# Patient Record
Sex: Female | Born: 1935 | Race: White | Hispanic: No | Smoking: Never smoker
Health system: Southern US, Community
[De-identification: ages and names within clinical notes are randomized; demographics above are authoritative.]

## PROBLEM LIST (undated history)

## (undated) DIAGNOSIS — K219 Gastro-esophageal reflux disease without esophagitis: Secondary | ICD-10-CM

## (undated) DIAGNOSIS — M1990 Unspecified osteoarthritis, unspecified site: Secondary | ICD-10-CM

## (undated) DIAGNOSIS — I10 Essential (primary) hypertension: Secondary | ICD-10-CM

## (undated) HISTORY — PX: HERNIA REPAIR: SHX51

## (undated) HISTORY — DX: Gastro-esophageal reflux disease without esophagitis: K21.9

## (undated) HISTORY — DX: Unspecified osteoarthritis, unspecified site: M19.90

---

## 1997-09-29 ENCOUNTER — Inpatient Hospital Stay (HOSPITAL_COMMUNITY): Admission: EM | Admit: 1997-09-29 | Discharge: 1997-10-02 | Payer: Self-pay | Admitting: Emergency Medicine

## 1997-10-20 ENCOUNTER — Encounter: Admission: RE | Admit: 1997-10-20 | Discharge: 1997-10-20 | Payer: Self-pay | Admitting: Family Medicine

## 1997-10-25 ENCOUNTER — Ambulatory Visit (HOSPITAL_COMMUNITY): Admission: RE | Admit: 1997-10-25 | Discharge: 1997-10-25 | Payer: Self-pay

## 1997-11-03 ENCOUNTER — Encounter: Admission: RE | Admit: 1997-11-03 | Discharge: 1997-11-03 | Payer: Self-pay | Admitting: Family Medicine

## 1997-12-06 ENCOUNTER — Encounter: Admission: RE | Admit: 1997-12-06 | Discharge: 1997-12-06 | Payer: Self-pay | Admitting: Family Medicine

## 1997-12-07 ENCOUNTER — Encounter: Admission: RE | Admit: 1997-12-07 | Discharge: 1997-12-07 | Payer: Self-pay | Admitting: Sports Medicine

## 1997-12-17 ENCOUNTER — Other Ambulatory Visit: Admission: RE | Admit: 1997-12-17 | Discharge: 1997-12-17 | Payer: Self-pay | Admitting: Urology

## 2000-03-31 ENCOUNTER — Encounter: Admission: RE | Admit: 2000-03-31 | Discharge: 2000-03-31 | Payer: Self-pay

## 2001-03-26 ENCOUNTER — Emergency Department (HOSPITAL_COMMUNITY): Admission: EM | Admit: 2001-03-26 | Discharge: 2001-03-26 | Payer: Self-pay | Admitting: Emergency Medicine

## 2001-03-26 ENCOUNTER — Encounter: Payer: Self-pay | Admitting: Emergency Medicine

## 2001-03-27 ENCOUNTER — Emergency Department (HOSPITAL_COMMUNITY): Admission: EM | Admit: 2001-03-27 | Discharge: 2001-03-27 | Payer: Self-pay | Admitting: Emergency Medicine

## 2001-05-19 ENCOUNTER — Encounter: Payer: Self-pay | Admitting: Surgery

## 2001-05-19 ENCOUNTER — Encounter: Admission: RE | Admit: 2001-05-19 | Discharge: 2001-05-19 | Payer: Self-pay | Admitting: Surgery

## 2001-05-21 ENCOUNTER — Ambulatory Visit (HOSPITAL_BASED_OUTPATIENT_CLINIC_OR_DEPARTMENT_OTHER): Admission: RE | Admit: 2001-05-21 | Discharge: 2001-05-22 | Payer: Self-pay | Admitting: Surgery

## 2006-03-05 ENCOUNTER — Ambulatory Visit: Payer: Self-pay

## 2015-03-12 ENCOUNTER — Emergency Department
Admission: EM | Admit: 2015-03-12 | Discharge: 2015-03-12 | Disposition: A | Discharge status: Home / Self Care | Payer: Medicare Other | Attending: Emergency Medicine | Admitting: Emergency Medicine

## 2015-03-12 ENCOUNTER — Encounter: Payer: Self-pay | Admitting: *Deleted

## 2015-03-12 DIAGNOSIS — I159 Secondary hypertension, unspecified: Secondary | ICD-10-CM | POA: Insufficient documentation

## 2015-03-12 DIAGNOSIS — Z880 Allergy status to penicillin: Secondary | ICD-10-CM | POA: Insufficient documentation

## 2015-03-12 DIAGNOSIS — I10 Essential (primary) hypertension: Secondary | ICD-10-CM | POA: Diagnosis present

## 2015-03-12 LAB — COMPREHENSIVE METABOLIC PANEL
ALBUMIN: 3.7 g/dL (ref 3.5–5.0)
ALK PHOS: 75 U/L (ref 38–126)
ALT: 12 U/L — ABNORMAL LOW (ref 14–54)
ANION GAP: 6 (ref 5–15)
AST: 22 U/L (ref 15–41)
BILIRUBIN TOTAL: 0.6 mg/dL (ref 0.3–1.2)
BUN: 12 mg/dL (ref 6–20)
CALCIUM: 8.7 mg/dL — AB (ref 8.9–10.3)
CO2: 28 mmol/L (ref 22–32)
Chloride: 107 mmol/L (ref 101–111)
Creatinine, Ser: 0.71 mg/dL (ref 0.44–1.00)
GFR calc Af Amer: >60 mL/min (ref >60)
GFR calc non Af Amer: >60 mL/min (ref >60)
GLUCOSE: 120 mg/dL — AB (ref 65–99)
Potassium: 3.5 mmol/L (ref 3.5–5.1)
Sodium: 141 mmol/L (ref 135–145)
TOTAL PROTEIN: 6.6 g/dL (ref 6.5–8.1)

## 2015-03-12 LAB — URINALYSIS COMPLETE WITH MICROSCOPIC (ARMC ONLY)
BILIRUBIN URINE: NEGATIVE
Bacteria, UA: NONE SEEN
Glucose, UA: NEGATIVE mg/dL
KETONES UR: NEGATIVE mg/dL
LEUKOCYTES UA: NEGATIVE
Nitrite: NEGATIVE
Protein, ur: NEGATIVE mg/dL
SQUAMOUS EPITHELIAL / LPF: NONE SEEN
Specific Gravity, Urine: 1.003 — ABNORMAL LOW (ref 1.005–1.030)
pH: 6 (ref 5.0–8.0)

## 2015-03-12 LAB — CBC WITH DIFFERENTIAL/PLATELET
BASOS PCT: 1 %
Basophils Absolute: 0 10*3/uL (ref 0–0.1)
Eosinophils Absolute: 0 10*3/uL (ref 0–0.7)
Eosinophils Relative: 0 %
HEMATOCRIT: 37.2 % (ref 35.0–47.0)
HEMOGLOBIN: 12.3 g/dL (ref 12.0–16.0)
LYMPHS ABS: 2.4 10*3/uL (ref 1.0–3.6)
LYMPHS PCT: 30 %
MCH: 27.7 pg (ref 26.0–34.0)
MCHC: 33.1 g/dL (ref 32.0–36.0)
MCV: 83.5 fL (ref 80.0–100.0)
MONOS PCT: 10 %
Monocytes Absolute: 0.8 10*3/uL (ref 0.2–0.9)
NEUTROS ABS: 4.7 10*3/uL (ref 1.4–6.5)
NEUTROS PCT: 59 %
Platelets: 244 10*3/uL (ref 150–440)
RBC: 4.45 MIL/uL (ref 3.80–5.20)
RDW: 14.8 % — ABNORMAL HIGH (ref 11.5–14.5)
WBC: 8 10*3/uL (ref 3.6–11.0)

## 2015-03-12 LAB — TROPONIN I: Troponin I: <0.03 ng/mL (ref <0.031)

## 2015-03-12 NOTE — ED Provider Notes (Signed)
[facility] Emergency Department Provider Note  ____________________________________________  Time seen: 1245  I have reviewed the triage vital signs and the nursing notes.   HISTORY  Chief Complaint Hypertension   History limited by: Not Limited   HPI Alexis Whitney is a 81 y.o. female who presents to the emergency department today because she was not feeling well. She states that when she woke up this morning she was feeling her normal self. Then when she was doing a load of laundry she started trembling. She states it was her whole body. She was not in any pain during this, she denied any shortness of breath or chest pain. She states that she now feels better. She has not had any recent fevers, no recent sick contacts, no nausea or vomiting, no change in urination or bowel movement.      No past medical history on file.  There are no active problems to display for this patient.   No past surgical history on file.  No current outpatient prescriptions on file.  Allergies Aspirin; Sulfa antibiotics; and Penicillins  No family history on file.  Social History Social History  Substance Use Topics  . Smoking status: Not on file  . Smokeless tobacco: Not on file  . Alcohol Use: Not on file    Review of Systems  Constitutional: Negative for fever. Cardiovascular: Negative for chest pain. Respiratory: Negative for shortness of breath. Gastrointestinal: Negative for abdominal pain, vomiting and diarrhea. Genitourinary: Negative for dysuria. Musculoskeletal: Negative for back pain. Skin: Negative for rash. Neurological: Negative for headaches, focal weakness or numbness.   10-point ROS otherwise negative.  ____________________________________________   PHYSICAL EXAM:  VITAL SIGNS: ED Triage Vitals  Enc Vitals Group     BP 03/12/15 1241 179/75 mmHg     Pulse Rate 03/12/15 1241 82     Resp 03/12/15 1241 18     Temp 03/12/15 1241 98 F  (36.7 C)     Temp src --      SpO2 03/12/15 1241 97 %   Constitutional: Alert and oriented. Well appearing and in no distress. Eyes: Conjunctivae are normal. PERRL. Normal extraocular movements. ENT   Head: Normocephalic and atraumatic.   Nose: No congestion/rhinnorhea.   Mouth/Throat: Mucous membranes are moist.   Neck: No stridor. Hematological/Lymphatic/Immunilogical: No cervical lymphadenopathy. Cardiovascular: Normal rate, regular rhythm.  No murmurs, rubs, or gallops. Respiratory: Normal respiratory effort without tachypnea nor retractions. Breath sounds are clear and equal bilaterally. No wheezes/rales/rhonchi. Gastrointestinal: Soft and nontender. No distention.  Genitourinary: Deferred Musculoskeletal: Normal range of motion in all extremities. No joint effusions.  No lower extremity tenderness nor edema. Neurologic:  Normal speech and language. No gross focal neurologic deficits are appreciated. Speech is normal.  Skin:  Skin is warm, dry and intact. No rash noted. Psychiatric: Mood and affect are normal. Speech and behavior are normal. Patient exhibits appropriate insight and judgment.  ____________________________________________    LABS (pertinent positives/negatives)  Labs Reviewed  CBC WITH DIFFERENTIAL/PLATELET - Abnormal; Notable for the following:    RDW 14.8 (*)    All other components within normal limits  COMPREHENSIVE METABOLIC PANEL - Abnormal; Notable for the following:    Glucose, Bld 120 (*)    Calcium 8.7 (*)    ALT 12 (*)    All other components within normal limits  URINALYSIS COMPLETEWITH MICROSCOPIC ([facility] ONLY) - Abnormal; Notable for the following:    Color, Urine STRAW (*)    APPearance CLEAR (*)  Specific Gravity, Urine 1.003 (*)    Hgb urine dipstick 2+ (*)    All other components within normal limits  TROPONIN I     ____________________________________________   EKG  I, Phineas Semen, attending physician,  personally viewed and interpreted this EKG  EKG Time: 1251 Rate: 83 Rhythm: NSR Axis: normal Intervals: qtc 451 QRS: narrow ST changes: no st elevation Impression: no concerning findings   ____________________________________________    RADIOLOGY  None  ____________________________________________   PROCEDURES  Procedure(s) performed: None  Critical Care performed: No  ____________________________________________   INITIAL IMPRESSION / ASSESSMENT AND PLAN / ED COURSE  Pertinent labs & imaging results that were available during my care of the patient were reviewed by me and considered in my medical decision making (see chart for details).  Patient presents to the emergency department with concerns for feeling unwell. On physical exam patient appears well, no concerning findings. Blood work and urine without concerning findings per patient does state she feels better. I discussed return precautions with the patient. Encourage primary care follow-up.  ____________________________________________   FINAL CLINICAL IMPRESSION(S) / ED DIAGNOSES  Final diagnoses:  Secondary hypertension, unspecified     Phineas Semen, MD 03/12/15 1528

## 2015-03-12 NOTE — ED Notes (Signed)
Patient denies pain and is resting comfortably.  

## 2015-03-12 NOTE — Discharge Instructions (Signed)
Please seek medical attention for any high fevers, chest pain, shortness of breath, change in behavior, persistent vomiting, bloody stool or any other new or concerning symptoms. ° °Hypertension °Hypertension, commonly called high blood pressure, is when the force of blood pumping through your arteries is too strong. Your arteries are the blood vessels that carry blood from your heart throughout your body. A blood pressure reading consists of a higher number over a lower number, such as 110/72. The higher number (systolic) is the pressure inside your arteries when your heart pumps. The lower number (diastolic) is the pressure inside your arteries when your heart relaxes. Ideally you want your blood pressure below 120/80. °Hypertension forces your heart to work harder to pump blood. Your arteries may become narrow or stiff. Having hypertension puts you at risk for heart disease, stroke, and other problems.  °RISK FACTORS °Some risk factors for high blood pressure are controllable. Others are not.  °Risk factors you cannot control include:  °· Race. You may be at higher risk if you are African American. °· Age. Risk increases with age. °· Gender. Men are at higher risk than women before age 45 years. After age 65, women are at higher risk than men. °Risk factors you can control include: °· Not getting enough exercise or physical activity. °· Being overweight. °· Getting too much fat, sugar, calories, or salt in your diet. °· Drinking too much alcohol. °SIGNS AND SYMPTOMS °Hypertension does not usually cause signs or symptoms. Extremely high blood pressure (hypertensive crisis) may cause headache, anxiety, shortness of breath, and nosebleed. °DIAGNOSIS  °To check if you have hypertension, your health care provider will measure your blood pressure while you are seated, with your arm held at the level of your heart. It should be measured at least twice using the same arm. Certain conditions can cause a difference in  blood pressure between your right and left arms. A blood pressure reading that is higher than normal on one occasion does not mean that you need treatment. If one blood pressure reading is high, ask your health care provider about having it checked again. °TREATMENT  °Treating high blood pressure includes making lifestyle changes and possibly taking medicine. Living a healthy lifestyle can help lower high blood pressure. You may need to change some of your habits. °Lifestyle changes may include: °· Following the DASH diet. This diet is high in fruits, vegetables, and whole grains. It is low in salt, red meat, and added sugars. °· Getting at least 2½ hours of brisk physical activity every week. °· Losing weight if necessary. °· Not smoking. °· Limiting alcoholic beverages. °· Learning ways to reduce stress. ° If lifestyle changes are not enough to get your blood pressure under control, your health care provider may prescribe medicine. You may need to take more than one. Work closely with your health care provider to understand the risks and benefits. °HOME CARE INSTRUCTIONS °· Have your blood pressure rechecked as directed by your health care provider.   °· Take medicines only as directed by your health care provider. Follow the directions carefully. Blood pressure medicines must be taken as prescribed. The medicine does not work as well when you skip doses. Skipping doses also puts you at risk for problems.   °· Do not smoke.   °· Monitor your blood pressure at home as directed by your health care provider.  °SEEK MEDICAL CARE IF:  °· You think you are having a reaction to medicines taken. °· You have recurrent headaches or feel   dizzy. °· You have swelling in your ankles. °· You have trouble with your vision. °SEEK IMMEDIATE MEDICAL CARE IF: °· You develop a severe headache or confusion. °· You have unusual weakness, numbness, or feel faint. °· You have severe chest or abdominal pain. °· You vomit repeatedly. °· You  have trouble breathing. °MAKE SURE YOU:  °· Understand these instructions. °· Will watch your condition. °· Will get help right away if you are not doing well or get worse. °Document Released: 06/16/2005 Document Revised: 10/31/2013 Document Reviewed: 04/08/2013 °[employer]® Patient Information ©2015 [employer]. This information is not intended to replace advice given to you by your health care provider. Make sure you discuss any questions you have with your health care provider. ° °

## 2015-03-12 NOTE — ED Notes (Signed)
Pt called EMS due to "not feeling well" Pt was found to be hypertensive. Pt denies any CP. Pt in no acute distress

## 2015-12-16 ENCOUNTER — Emergency Department
Admission: EM | Admit: 2015-12-16 | Discharge: 2015-12-16 | Disposition: A | Discharge status: Home / Self Care | Payer: Medicare Other | Attending: Emergency Medicine | Admitting: Emergency Medicine

## 2015-12-16 DIAGNOSIS — R251 Tremor, unspecified: Secondary | ICD-10-CM | POA: Diagnosis not present

## 2015-12-16 DIAGNOSIS — I159 Secondary hypertension, unspecified: Secondary | ICD-10-CM | POA: Diagnosis not present

## 2015-12-16 DIAGNOSIS — Z79899 Other long term (current) drug therapy: Secondary | ICD-10-CM | POA: Insufficient documentation

## 2015-12-16 DIAGNOSIS — R531 Weakness: Secondary | ICD-10-CM | POA: Diagnosis present

## 2015-12-16 HISTORY — DX: Essential (primary) hypertension: I10

## 2015-12-16 LAB — BASIC METABOLIC PANEL
Anion gap: 8 (ref 5–15)
BUN: 18 mg/dL (ref 6–20)
CHLORIDE: 106 mmol/L (ref 101–111)
CO2: 26 mmol/L (ref 22–32)
Calcium: 8.8 mg/dL — ABNORMAL LOW (ref 8.9–10.3)
Creatinine, Ser: 0.82 mg/dL (ref 0.44–1.00)
GFR calc Af Amer: >60 mL/min (ref >60)
GFR calc non Af Amer: >60 mL/min (ref >60)
GLUCOSE: 123 mg/dL — AB (ref 65–99)
POTASSIUM: 3.5 mmol/L (ref 3.5–5.1)
SODIUM: 140 mmol/L (ref 135–145)

## 2015-12-16 LAB — URINALYSIS COMPLETE WITH MICROSCOPIC (ARMC ONLY)
Bilirubin Urine: NEGATIVE
GLUCOSE, UA: NEGATIVE mg/dL
KETONES UR: NEGATIVE mg/dL
NITRITE: NEGATIVE
PROTEIN: NEGATIVE mg/dL
SPECIFIC GRAVITY, URINE: 1.008 (ref 1.005–1.030)
Squamous Epithelial / LPF: NONE SEEN
pH: 6 (ref 5.0–8.0)

## 2015-12-16 LAB — CBC
HEMATOCRIT: 38.4 % (ref 35.0–47.0)
HEMOGLOBIN: 12.7 g/dL (ref 12.0–16.0)
MCH: 27.1 pg (ref 26.0–34.0)
MCHC: 33.1 g/dL (ref 32.0–36.0)
MCV: 82 fL (ref 80.0–100.0)
Platelets: 272 10*3/uL (ref 150–440)
RBC: 4.69 MIL/uL (ref 3.80–5.20)
RDW: 14.6 % — ABNORMAL HIGH (ref 11.5–14.5)
WBC: 14.5 10*3/uL — AB (ref 3.6–11.0)

## 2015-12-16 LAB — TROPONIN I

## 2015-12-16 NOTE — ED Notes (Signed)
MD Webster at bedside 

## 2015-12-16 NOTE — ED Notes (Signed)
Reviewed d/c instructions, follow-up care with pt. Pt verbalized understanding 

## 2015-12-16 NOTE — Discharge Instructions (Signed)
Hypertension Hypertension, commonly called high blood pressure, is when the force of blood pumping through your arteries is too strong. Your arteries are the blood vessels that carry blood from your heart throughout your body. A blood pressure reading consists of a higher number over a lower number, such as 110/72. The higher number (systolic) is the pressure inside your arteries when your heart pumps. The lower number (diastolic) is the pressure inside your arteries when your heart relaxes. Ideally you want your blood pressure below 120/80. Hypertension forces your heart to work harder to pump blood. Your arteries may become narrow or stiff. Having untreated or uncontrolled hypertension can cause heart attack, stroke, kidney disease, and other problems. RISK FACTORS Some risk factors for high blood pressure are controllable. Others are not.  Risk factors you cannot control include:   Race. You may be at higher risk if you are African American.  Age. Risk increases with age.  Gender. Men are at higher risk than women before age 45 years. After age 65, women are at higher risk than men. Risk factors you can control include:  Not getting enough exercise or physical activity.  Being overweight.  Getting too much fat, sugar, calories, or salt in your diet.  Drinking too much alcohol. SIGNS AND SYMPTOMS Hypertension does not usually cause signs or symptoms. Extremely high blood pressure (hypertensive crisis) may cause headache, anxiety, shortness of breath, and nosebleed. DIAGNOSIS To check if you have hypertension, your health care provider will measure your blood pressure while you are seated, with your arm held at the level of your heart. It should be measured at least twice using the same arm. Certain conditions can cause a difference in blood pressure between your right and left arms. A blood pressure reading that is higher than normal on one occasion does not mean that you need treatment. If  it is not clear whether you have high blood pressure, you may be asked to return on a different day to have your blood pressure checked again. Or, you may be asked to monitor your blood pressure at home for 1 or more weeks. TREATMENT Treating high blood pressure includes making lifestyle changes and possibly taking medicine. Living a healthy lifestyle can help lower high blood pressure. You may need to change some of your habits. Lifestyle changes may include:  Following the DASH diet. This diet is high in fruits, vegetables, and whole grains. It is low in salt, red meat, and added sugars.  Keep your sodium intake below 2,300 mg per day.  Getting at least 30-45 minutes of aerobic exercise at least 4 times per week.  Losing weight if necessary.  Not smoking.  Limiting alcoholic beverages.  Learning ways to reduce stress. Your health care provider may prescribe medicine if lifestyle changes are not enough to get your blood pressure under control, and if one of the following is true:  You are 18-59 years of age and your systolic blood pressure is above 140.  You are 60 years of age or older, and your systolic blood pressure is above 150.  Your diastolic blood pressure is above 90.  You have diabetes, and your systolic blood pressure is over 140 or your diastolic blood pressure is over 90.  You have kidney disease and your blood pressure is above 140/90.  You have heart disease and your blood pressure is above 140/90. Your personal target blood pressure may vary depending on your medical conditions, your age, and other factors. HOME CARE INSTRUCTIONS    Have your blood pressure rechecked as directed by your health care provider.   Take medicines only as directed by your health care provider. Follow the directions carefully. Blood pressure medicines must be taken as prescribed. The medicine does not work as well when you skip doses. Skipping doses also puts you at risk for  problems.  Do not smoke.   Monitor your blood pressure at home as directed by your health care provider. SEEK MEDICAL CARE IF:   You think you are having a reaction to medicines taken.  You have recurrent headaches or feel dizzy.  You have swelling in your ankles.  You have trouble with your vision. SEEK IMMEDIATE MEDICAL CARE IF:  You develop a severe headache or confusion.  You have unusual weakness, numbness, or feel faint.  You have severe chest or abdominal pain.  You vomit repeatedly.  You have trouble breathing. MAKE SURE YOU:   Understand these instructions.  Will watch your condition.  Will get help right away if you are not doing well or get worse.   This information is not intended to replace advice given to you by your health care provider. Make sure you discuss any questions you have with your health care provider.   Document Released: 06/16/2005 Document Revised: 10/31/2014 Document Reviewed: 04/08/2013 Elsevier Interactive Patient Education 2016 [employer]  Weakness Weakness is a lack of strength. It may be felt all over the body (generalized) or in one specific part of the body (focal). Some causes of weakness can be serious. You may need further medical evaluation, especially if you are elderly or you have a history of immunosuppression (such as chemotherapy or HIV), kidney disease, heart disease, or diabetes. CAUSES  Weakness can be caused by many different things, including:  Infection.  Physical exhaustion.  Internal bleeding or other blood loss that results in a lack of red blood cells (anemia).  Dehydration. This cause is more common in elderly people.  Side effects or electrolyte abnormalities from medicines, such as pain medicines or sedatives.  Emotional distress, anxiety, or depression.  Circulation problems, especially severe peripheral arterial disease.  Heart disease, such as rapid atrial fibrillation, bradycardia, or heart  failure.  Nervous system disorders, such as Guillain-Barr syndrome, multiple sclerosis, or stroke. DIAGNOSIS  To find the cause of your weakness, your caregiver will take your history and perform a physical exam. Lab tests or X-rays may also be ordered, if needed. TREATMENT  Treatment of weakness depends on the cause of your symptoms and can vary greatly. HOME CARE INSTRUCTIONS   Rest as needed.  Eat a well-balanced diet.  Try to get some exercise every day.  Only take over-the-counter or prescription medicines as directed by your caregiver. SEEK MEDICAL CARE IF:   Your weakness seems to be getting worse or spreads to other parts of your body.  You develop new aches or pains. SEEK IMMEDIATE MEDICAL CARE IF:   You cannot perform your normal daily activities, such as getting dressed and feeding yourself.  You cannot walk up and down stairs, or you feel exhausted when you do so.  You have shortness of breath or chest pain.  You have difficulty moving parts of your body.  You have weakness in only one area of the body or on only one side of the body.  You have a fever.  You have trouble speaking or swallowing.  You cannot control your bladder or bowel movements.  You have black or bloody vomit or stools. MAKE  SURE YOU:  Understand these instructions.  Will watch your condition.  Will get help right away if you are not doing well or get worse.   This information is not intended to replace advice given to you by your health care provider. Make sure you discuss any questions you have with your health care provider.   Document Released: 06/16/2005 Document Revised: 12/16/2011 Document Reviewed: 08/15/2011 Elsevier Interactive Patient Education 2016 [employer]  Tremor A tremor is trembling or shaking that you cannot control. Most tremors affect the hands or arms. Tremors can also affect the head, vocal cords, face, and other parts of the body. There are many types of  tremors. Common types include:   Essential tremor. These usually occur in people over the age of 80. It may run in families and can happen in otherwise healthy people.   Resting tremor. These occur when the muscles are at rest, such as when your hands are resting in your lap. People with Parkinson disease often have resting tremors.   Postural tremor. These occur when you try to hold a pose, such as keeping your hands outstretched.   Kinetic tremor. These occur during purposeful movement, such as trying to touch a finger to your nose.   Task-specific tremor. These may occur when you perform tasks such as handwriting, speaking, or standing.   Psychogenic tremor. These dramatically lessen or disappear when you are distracted. They can happen in people of all ages.  Some types of tremors have no known cause. Tremors can also be a symptom of nervous system problems (neurological disorders) that may occur with aging. Some tremors go away with treatment while others do not.  HOME CARE INSTRUCTIONS Watch your tremor for any changes. The following actions may help to lessen any discomfort you are feeling:  Take medicines only as directed by your health care provider.   Limit alcohol intake to no more than 1 drink per day for nonpregnant women and 2 drinks per day for men. One drink equals 12 oz of beer, 5 oz of wine, or 1 oz of hard liquor.  Do not use any tobacco products, including cigarettes, chewing tobacco, or electronic cigarettes. If you need help quitting, ask your health care provider.   Avoid extreme heat or cold.   Limit the amount of caffeine you consumeas directed by your health care provider.   Try to get 8 hours of sleep each night.  Find ways to manage your stress, such as meditation or yoga.  Keep all follow-up visits as directed by your health care provider. This is important. SEEK MEDICAL CARE IF:  You start having a tremor after starting a new  medicine.  You have tremor with other symptoms such as:  Numbness.  Tingling.  Pain.  Weakness.  Your tremor gets worse.  Your tremor interferes with your day-to-day life.   This information is not intended to replace advice given to you by your health care provider. Make sure you discuss any questions you have with your health care provider.   Document Released: 06/06/2002 Document Revised: 07/07/2014 Document Reviewed: 12/12/2013 Elsevier Interactive Patient Education [employer]

## 2015-12-16 NOTE — ED Provider Notes (Signed)
[facility] Emergency Department Provider Note   ____________________________________________  Time seen: Approximately 351 AM  I have reviewed the triage vital signs and the nursing notes.   HISTORY  Chief Complaint Hypertension    HPI Alexis Whitney is a 82 y.o. female who comes into the hospital today with elevated blood pressure, shaking and weak feeling. The patient reports that she was in bed when she had just dozed off and something woke her up. She reports that she didn't feel right. Her legs were trembling and her daughter had to help her out of bed. The patient denies any headache denies chest pain denies any shortness of breath. She reports that she felt well today and cleaned out much. The patient reports that she took her blood pressure medicine today without any difficulty. The patient denies any nausea or vomiting denies any headache denies blurred vision. She reports that she feels less weak than she did previously right now and that she is not shaking any more. The patient was concerned about what might be going on so she decided to come into the hospital for evaluation.   Past Medical History  Diagnosis Date  . Hypertension     There are no active problems to display for this patient.   Past Surgical History  Procedure Laterality Date  . Hernia repair      Current Outpatient Rx  Name  Route  Sig  Dispense  Refill  . lisinopril (PRINIVIL,ZESTRIL) 5 MG tablet   Oral   Take 5 mg by mouth daily.         . pantoprazole (PROTONIX) 20 MG tablet   Oral   Take 20 mg by mouth daily.           Allergies Aspirin; Sulfa antibiotics; and Penicillins  No family history on file.  Social History Social History  Substance Use Topics  . Smoking status: Never Smoker   . Smokeless tobacco: Never Used  . Alcohol Use: No    Review of Systems Constitutional: Shakiness Eyes: No visual changes. ENT: No sore throat. Cardiovascular:  Denies chest pain. Respiratory: Denies shortness of breath. Gastrointestinal: No abdominal pain.  No nausea, no vomiting.  No diarrhea.  No constipation. Genitourinary: Negative for dysuria. Musculoskeletal: Negative for back pain. Skin: Negative for rash. Neurological: Generalized weakness.  10-point ROS otherwise negative.  ____________________________________________   PHYSICAL EXAM:  VITAL SIGNS: ED Triage Vitals  Enc Vitals Group     BP 12/16/15 0231 196/70 mmHg     Pulse Rate 12/16/15 0231 68     Resp 12/16/15 0231 16     Temp 12/16/15 0225 98.7 F (37.1 C)     Temp Source 12/16/15 0225 Oral     SpO2 12/16/15 0222 98 %     Weight 12/16/15 0231 116 lb (52.617 kg)     Height 12/16/15 0231 5\' 4"  (1.626 m)     Head Cir --      Peak Flow --      Pain Score --      Pain Loc --      Pain Edu? --      Excl. in GC? --     Constitutional: Alert and oriented. Well appearing and in no acute distress. Eyes: Conjunctivae are normal. PERRL. EOMI. Head: Atraumatic. Nose: No congestion/rhinnorhea. Mouth/Throat: Mucous membranes are moist.  Oropharynx non-erythematous. Cardiovascular: Normal rate, regular rhythm. Grossly normal heart sounds.  Good peripheral circulation. Respiratory: Normal respiratory effort.  No retractions.  Lungs CTAB. Gastrointestinal: Soft and nontender. No distention. Positive bowel sounds Musculoskeletal: No lower extremity tenderness nor edema.   Neurologic:  Normal speech and language. Cranial nerves II through XII are grossly intact with no focal motor or neuro deficits Skin:  Skin is warm, dry and intact.  Psychiatric: Mood and affect are normal.   ____________________________________________   LABS (all labs ordered are listed, but only abnormal results are displayed)  Labs Reviewed  BASIC METABOLIC PANEL - Abnormal; Notable for the following:    Glucose, Bld 123 (*)    Calcium 8.8 (*)    All other components within normal limits  CBC -  Abnormal; Notable for the following:    WBC 14.5 (*)    RDW 14.6 (*)    All other components within normal limits  URINALYSIS COMPLETEWITH MICROSCOPIC ([facility] ONLY) - Abnormal; Notable for the following:    Color, Urine STRAW (*)    APPearance CLEAR (*)    Hgb urine dipstick 2+ (*)    Leukocytes, UA TRACE (*)    Bacteria, UA RARE (*)    All other components within normal limits  TROPONIN I  TROPONIN I   ____________________________________________  EKG  ED ECG REPORT I, Rebecka Apley, the attending physician, personally viewed and interpreted this ECG.   Date: 12/16/2015  EKG Time: 232  Rate: 75  Rhythm: normal sinus rhythm  Axis: normal  Intervals:none  ST&T Change: none  ____________________________________________  RADIOLOGY  None ____________________________________________   PROCEDURES  Procedure(s) performed: None  Critical Care performed: No  ____________________________________________   INITIAL IMPRESSION / ASSESSMENT AND PLAN / ED COURSE  Pertinent labs & imaging results that were available during my care of the patient were reviewed by me and considered in my medical decision making (see chart for details).  This is an 82 year old female who comes into the hospital today with some elevated blood pressure as well as feeling shaky and having generalized weakness. Symptoms have resolved at this time and the patient's blood pressure is improving without any significant intervention. I did check the patient's urine with a concern for infection making her feel unwell but her urinalysis is unremarkable. The patient does have a white blood cell count of 14.5 But she has no other complaints at all to explain her symptoms. I feel the patient should follow back up with her primary care physician for further evaluation. The patient has no further complaints or concerns needed as her family. ____________________________________________   FINAL CLINICAL  IMPRESSION(S) / ED DIAGNOSES  Final diagnoses:  Secondary hypertension, unspecified  Weakness  Tremor      NEW MEDICATIONS STARTED DURING THIS VISIT:  Discharge Medication List as of 12/16/2015  6:33 AM       Note:  This document was prepared using Dragon voice recognition software and may include unintentional dictation errors.    Rebecka Apley, MD 12/16/15 [phone]

## 2015-12-16 NOTE — ED Notes (Signed)
Per EMS: Pt c/o of hypertension. Pt reports she woke up at 1300 and had all over body trembling. Pt had similar episode in past, and was dx with hypertension and placed on antihypertensive medication. Pt denies pain, LOC.

## 2016-02-03 NOTE — Progress Notes (Signed)
02/04/2016 3:50 PM   Alexis Whitney 03-03-1925 [id number]  Referring provider: Raynelle Bring [address] [place], [place] [address]  Chief Complaint  Patient presents with  . New Patient (Initial Visit)    hematuria    HPI: Patient is a 82 -year-old Caucasian female who presents today as a referral from their PCP, [facility] , for microscopic hematuria.  Patient was found to have microscopic hematuria on 12/16/2015 with 6-30 RBC's/hpf.  Patient doesn't have a prior history of microscopic hematuria.    She does not have a prior history of recurrent urinary tract infections, nephrolithiasis, trauma to the genitourinary tract or malignancies of the genitourinary tract.   She does  have a family medical history of malignancies of the genitourinary tract (mother with kidney cancer) and hematuria (sisters).    She does not have a family medical history of nephrolithiasis or hematuria.   Today, she is not having symptoms of frequent urination, urgency, dysuria, nocturia, incontinence, hesitancy, intermittency, straining to urinate or a weak urinary stream.  Her UA today demonstrates 3-10 RBC's/hpf.  She is not experiencing any suprapubic pain, abdominal pain or flank pain.  She denies any recent fevers, chills, nausea or vomiting.   She is not a smoker.   She is not exposed to secondhand smoke.  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    PMH: Past Medical History:  Diagnosis Date  . Arthritis   . GERD (gastroesophageal reflux disease)   . Hypertension     Surgical History: Past Surgical History:  Procedure Laterality Date  . HERNIA REPAIR      Home Medications:    Medication List       Accurate as of 02/04/16  3:50 PM. Always use your most recent med list.          lisinopril 5 MG tablet Commonly known as:  PRINIVIL,ZESTRIL Take 5 mg by mouth daily.   pantoprazole 20 MG tablet Commonly known as:  PROTONIX Take 20 mg by mouth  daily.       Allergies:  Allergies  Allergen Reactions  . Influenza Vaccines Anaphylaxis    Flu vaccine- causes anaphylaxis  . Aspirin Other (See Comments)    "makes me feel bad"  . Sulfa Antibiotics Other (See Comments)    unknown  . Penicillins Rash    Has patient had a PCN reaction causing immediate rash, facial/tongue/throat swelling, SOB or lightheadedness with hypotension: Yes Has patient had a PCN reaction causing severe rash involving mucus membranes or skin necrosis: No Has patient had a PCN reaction that required hospitalization No Has patient had a PCN reaction occurring within the last 10 years: No If all of the above answers are "NO", then may proceed with Cephalosporin use.     Family History: Family History  Problem Relation Age of Onset  . Cancer Mother     Kidney  . Cancer Sister     Lung  . Bladder Cancer Neg Hx     Social History:  reports that she has never smoked. She has never used smokeless tobacco. She reports that she does not drink alcohol or use drugs.  ROS: UROLOGY Frequent Urination?: No Hard to postpone urination?: No Burning/pain with urination?: No Get up at night to urinate?: No Leakage of urine?: No Urine stream starts and stops?: No Trouble starting stream?: No Do you have to strain to urinate?: No Blood in urine?: Yes Urinary tract infection?: No Sexually transmitted disease?: No Injury to  kidneys or bladder?: No Painful intercourse?: No Weak stream?: No Currently pregnant?: No Vaginal bleeding?: No Last menstrual period?: n  Gastrointestinal Nausea?: No Vomiting?: No Indigestion/heartburn?: No Diarrhea?: No Constipation?: No  Constitutional Fever: No Night sweats?: No Weight loss?: No Fatigue?: No  Skin Skin rash/lesions?: No Itching?: No  Eyes Blurred vision?: No Double vision?: No  Ears/Nose/Throat Sore throat?: No Sinus problems?: No  Hematologic/Lymphatic Swollen glands?: No Easy bruising?:  Yes  Cardiovascular Leg swelling?: No Chest pain?: No  Respiratory Cough?: No Shortness of breath?: No  Endocrine Excessive thirst?: No  Musculoskeletal Back pain?: No Joint pain?: No  Neurological Headaches?: No Dizziness?: No  Psychologic Depression?: No Anxiety?: No  Physical Exam: BP 122/73 (BP Location: Left Arm, Patient Position: Sitting, Cuff Size: Normal)   Pulse 88   Ht 5\' 4"  (1.626 m)   Wt 117 lb 1.6 oz (53.1 kg)   BMI 20.10 kg/m   Constitutional: Well nourished. Alert and oriented, No acute distress. HEENT: [place] AT, moist mucus membranes. Trachea midline, no masses. Cardiovascular: No clubbing, cyanosis, or edema. Respiratory: Normal respiratory effort, no increased work of breathing. GI: Abdomen is soft, non tender, non distended, no abdominal masses. Liver and spleen not palpable.  No hernias appreciated.  Stool sample for occult testing is not indicated.   GU: No CVA tenderness.  No bladder fullness or masses.  Atrophic external genitalia, normal pubic hair distribution, no lesions.  Normal urethral meatus, no lesions, no prolapse, no discharge.   No urethral masses, tenderness and/or tenderness. No bladder fullness, tenderness or masses. Pale vagina mucosa, poor estrogen effect, no discharge, no lesions, good pelvic support, no cystocele or rectocele noted.  No cervical motion tenderness.  Uterus is freely mobile and non-fixed.  No adnexal/parametria masses or tenderness noted.  Anus and perineum are without rashes or lesions.    Skin: No rashes, bruises or suspicious lesions. Lymph: No cervical or inguinal adenopathy. Neurologic: Grossly intact, no focal deficits, moving all 4 extremities. Psychiatric: Normal mood and affect.  Laboratory Data: Lab Results  Component Value Date   WBC 14.5 (H) 12/16/2015   HGB 12.7 12/16/2015   HCT 38.4 12/16/2015   MCV 82.0 12/16/2015   PLT 272 12/16/2015    Lab Results  Component Value Date   CREATININE 0.82  12/16/2015    Lab Results  Component Value Date   AST 22 03/12/2015   Lab Results  Component Value Date   ALT 12 (L) 03/12/2015     Urinalysis Significant for 3-10 RBC's/hpf.  See EPIC.   Assessment & Plan:    1. Microscopic hematuria   -  I explained to the patient that there are a number of causes that can be associated with blood in the urine, such as stones,  UTI's, damage to the urinary tract and/or cancer.   - patient is elderly and not wanting to undergo a formal hematuria work up at this time, which I think is reasonable  - start with RUS  - if RUS is abnormal, further discussion about undergoing CT Urogram  - she will return following all of the above for discussion of the results.     Return for RUS report.  These notes generated with voice recognition software. I apologize for typographical errors.  Michiel CowboySHANNON Kire Ferg, PA-C  [facility] [address], [address] [place], [place] [address] [phone]

## 2016-02-04 ENCOUNTER — Ambulatory Visit (INDEPENDENT_AMBULATORY_CARE_PROVIDER_SITE_OTHER): Payer: Medicare Other | Admitting: Urology

## 2016-02-04 ENCOUNTER — Encounter: Payer: Self-pay | Admitting: Urology

## 2016-02-04 VITALS — BP 122/73 | HR 88 | Ht 64.0 in | Wt 117.1 lb

## 2016-02-04 DIAGNOSIS — R3129 Other microscopic hematuria: Secondary | ICD-10-CM

## 2016-02-04 DIAGNOSIS — K219 Gastro-esophageal reflux disease without esophagitis: Secondary | ICD-10-CM | POA: Insufficient documentation

## 2016-02-04 DIAGNOSIS — I10 Essential (primary) hypertension: Secondary | ICD-10-CM | POA: Insufficient documentation

## 2016-02-04 LAB — URINALYSIS, COMPLETE
BILIRUBIN UA: NEGATIVE
Glucose, UA: NEGATIVE
KETONES UA: NEGATIVE
LEUKOCYTES UA: NEGATIVE
NITRITE UA: NEGATIVE
PH UA: 5.5 (ref 5.0–7.5)
Protein, UA: NEGATIVE
Specific Gravity, UA: 1.015 (ref 1.005–1.030)
Urobilinogen, Ur: 0.2 mg/dL (ref 0.2–1.0)

## 2016-02-04 LAB — MICROSCOPIC EXAMINATION

## 2016-02-12 ENCOUNTER — Ambulatory Visit
Admission: RE | Admit: 2016-02-12 | Discharge: 2016-02-12 | Disposition: A | Discharge status: Home / Self Care | Payer: Medicare Other | Source: Ambulatory Visit | Attending: Urology | Admitting: Urology

## 2016-02-12 DIAGNOSIS — R3129 Other microscopic hematuria: Secondary | ICD-10-CM | POA: Diagnosis present

## 2016-02-12 DIAGNOSIS — R93422 Abnormal radiologic findings on diagnostic imaging of left kidney: Secondary | ICD-10-CM | POA: Insufficient documentation

## 2016-02-18 ENCOUNTER — Ambulatory Visit (INDEPENDENT_AMBULATORY_CARE_PROVIDER_SITE_OTHER): Payer: Medicare Other | Admitting: Urology

## 2016-02-18 ENCOUNTER — Encounter: Payer: Self-pay | Admitting: Urology

## 2016-02-18 VITALS — BP 144/71 | HR 69 | Ht 63.0 in | Wt 117.2 lb

## 2016-02-18 DIAGNOSIS — R3129 Other microscopic hematuria: Secondary | ICD-10-CM

## 2016-02-18 NOTE — Progress Notes (Addendum)
02/18/2016 2:47 PM   Alexis Austinuth S Whitney 1925/06/13 [id number]  [facility] [address] [place], [place] [id number]  Chief Complaint  Patient presents with  . Results    RUS    HPI: Patient is an 82 year old Caucasian female who presents today to discuss RUS.    Background history Patient is a 89 -year-old Caucasian female who presents today as a referral from their PCP, [facility] , for microscopic hematuria.  Patient was found to have microscopic hematuria on 12/16/2015 with 6-30 RBC's/hpf.  Patient doesn't have a prior history of microscopic hematuria.  She does not have a prior history of recurrent urinary tract infections, nephrolithiasis, trauma to the genitourinary tract or malignancies of the genitourinary tract.  She does  have a family medical history of malignancies of the genitourinary tract (mother with kidney cancer) and hematuria (sisters).   She does not have a family medical history of nephrolithiasis or hematuria.  She is not a smoker.   She is not exposed to secondhand smoke.  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   RUS completed on 02/12/2016 noted a possible small non shadowing left renal calculus.  No renal obstruction or mass.  I personally reviewed the films.    Today, she is not having symptoms of frequent urination, urgency, dysuria, nocturia, incontinence, hesitancy, intermittency, straining to urinate or a weak urinary stream.   She is not experiencing any suprapubic pain, abdominal pain or flank pain.  She denies any recent fevers, chills, nausea or vomiting.      PMH: Past Medical History:  Diagnosis Date  . Arthritis   . GERD (gastroesophageal reflux disease)   . Hypertension     Surgical History: Past Surgical History:  Procedure Laterality Date  . HERNIA REPAIR      Home Medications:    Medication List       Accurate as of 02/18/16  2:47 PM. Always use your most recent med list.          lisinopril 5 MG tablet Commonly known as:  PRINIVIL,ZESTRIL Take 5 mg by mouth daily.   pantoprazole 20 MG tablet Commonly known as:  PROTONIX Take 20 mg by mouth daily.       Allergies:  Allergies  Allergen Reactions  . Influenza Vaccines Anaphylaxis    Flu vaccine- causes anaphylaxis  . Aspirin Other (See Comments)    "makes me feel bad"  . Sulfa Antibiotics Other (See Comments)    unknown  . Penicillins Rash    Has patient had a PCN reaction causing immediate rash, facial/tongue/throat swelling, SOB or lightheadedness with hypotension: Yes Has patient had a PCN reaction causing severe rash involving mucus membranes or skin necrosis: No Has patient had a PCN reaction that required hospitalization No Has patient had a PCN reaction occurring within the last 10 years: No If all of the above answers are "NO", then may proceed with Cephalosporin use.     Family History: Family History  Problem Relation Age of Onset  . Cancer Mother     Kidney  . Cancer Sister     Lung  . Bladder Cancer Neg Hx     Social History:  reports that she has never smoked. She has never used smokeless tobacco. She reports that she does not drink alcohol or use drugs.  ROS: UROLOGY Frequent Urination?: No Hard to postpone urination?: No Burning/pain with urination?: No Get up at night to urinate?: No Leakage of  urine?: No Urine stream starts and stops?: No Trouble starting stream?: No Do you have to strain to urinate?: No Blood in urine?: No Urinary tract infection?: No Sexually transmitted disease?: No Injury to kidneys or bladder?: No Painful intercourse?: No Weak stream?: No Currently pregnant?: No Vaginal bleeding?: No Last menstrual period?: nn  Gastrointestinal Nausea?: No Vomiting?: No Indigestion/heartburn?: No Diarrhea?: No Constipation?: No  Constitutional Fever: No Night sweats?: No Weight loss?: No Fatigue?: No  Skin Skin rash/lesions?: No Itching?:  No  Eyes Blurred vision?: No Double vision?: No  Ears/Nose/Throat Sore throat?: No Sinus problems?: No  Hematologic/Lymphatic Swollen glands?: No Easy bruising?: No  Cardiovascular Leg swelling?: No Chest pain?: No  Respiratory Cough?: No Shortness of breath?: No  Endocrine Excessive thirst?: No  Musculoskeletal Back pain?: No Joint pain?: No  Neurological Headaches?: No Dizziness?: No  Psychologic Depression?: No Anxiety?: No  Physical Exam: BP (!) 144/71   Pulse 69   Ht 5\' 3"  (1.6 m)   Wt 117 lb 3.2 oz (53.2 kg)   BMI 20.76 kg/m   Constitutional: Well nourished. Alert and oriented, No acute distress. HEENT:  AT, moist mucus membranes. Trachea midline, no masses. Cardiovascular: No clubbing, cyanosis, or edema. Respiratory: Normal respiratory effort, no increased work of breathing. Skin: No rashes, bruises or suspicious lesions. Lymph: No cervical or inguinal adenopathy. Neurologic: Grossly intact, no focal deficits, moving all 4 extremities. Psychiatric: Normal mood and affect.  Laboratory Data: Lab Results  Component Value Date   WBC 14.5 (H) 12/16/2015   HGB 12.7 12/16/2015   HCT 38.4 12/16/2015   MCV 82.0 12/16/2015   PLT 272 12/16/2015    Lab Results  Component Value Date   CREATININE 0.82 12/16/2015    Lab Results  Component Value Date   AST 22 03/12/2015   Lab Results  Component Value Date   ALT 12 (L) 03/12/2015   Pertinent Imaging CLINICAL DATA:  Microscopic hematuria  EXAM: RENAL / URINARY TRACT ULTRASOUND COMPLETE  COMPARISON:  None.  FINDINGS: Right Kidney:  Length: 10.1 mm. Echogenicity within normal limits. No mass or hydronephrosis visualized.  Left Kidney:  Length: 10.4 cm. Echogenicity within normal limits. Negative for hydronephrosis or mass. 2 mm echogenic focus left midpole cortex without shadowing. This may represent a small stone in the kidney.  Bladder:  Appears normal for degree of  bladder distention.  IMPRESSION: Possible small non shadowing left renal calculus. No renal obstruction or mass.   Electronically Signed   By: Marlan Palauharles  Clark M.D.   On: 02/12/2016 14:09   Assessment & Plan:    1. Microscopic hematuria   -  RUS did not demonstrate any malignancies  - discussed with the patient the possibility of bladder cancer and offered a cystoscopy- she declines at this time  - asked the patient to return for gross hematuria  Return for gross hematuria.  These notes generated with voice recognition software. I apologize for typographical errors.  Michiel CowboySHANNON Maryfrances Portugal, PA-C  [facility] [address], [address] [place], [place] [address] [phone]

## 2017-04-12 ENCOUNTER — Encounter: Payer: Self-pay | Admitting: *Deleted

## 2017-04-12 ENCOUNTER — Emergency Department: Payer: Medicare Other

## 2017-04-12 ENCOUNTER — Emergency Department
Admission: EM | Admit: 2017-04-12 | Discharge: 2017-04-12 | Disposition: A | Discharge status: Home / Self Care | Payer: Medicare Other | Attending: Emergency Medicine | Admitting: Emergency Medicine

## 2017-04-12 DIAGNOSIS — I10 Essential (primary) hypertension: Secondary | ICD-10-CM | POA: Diagnosis not present

## 2017-04-12 DIAGNOSIS — Z79899 Other long term (current) drug therapy: Secondary | ICD-10-CM | POA: Diagnosis not present

## 2017-04-12 DIAGNOSIS — R55 Syncope and collapse: Secondary | ICD-10-CM | POA: Insufficient documentation

## 2017-04-12 LAB — BASIC METABOLIC PANEL
ANION GAP: 10 (ref 5–15)
BUN: 21 mg/dL — AB (ref 6–20)
CALCIUM: 9.2 mg/dL (ref 8.9–10.3)
CO2: 27 mmol/L (ref 22–32)
Chloride: 103 mmol/L (ref 101–111)
Creatinine, Ser: 1.13 mg/dL — ABNORMAL HIGH (ref 0.44–1.00)
GFR calc Af Amer: 48 mL/min — ABNORMAL LOW (ref >60)
GFR calc non Af Amer: 42 mL/min — ABNORMAL LOW (ref >60)
GLUCOSE: 170 mg/dL — AB (ref 65–99)
Potassium: 3.7 mmol/L (ref 3.5–5.1)
SODIUM: 140 mmol/L (ref 135–145)

## 2017-04-12 LAB — CBC
HCT: 40 % (ref 35.0–47.0)
HEMOGLOBIN: 13.1 g/dL (ref 12.0–16.0)
MCH: 27.5 pg (ref 26.0–34.0)
MCHC: 32.6 g/dL (ref 32.0–36.0)
MCV: 84.3 fL (ref 80.0–100.0)
Platelets: 324 10*3/uL (ref 150–440)
RBC: 4.75 MIL/uL (ref 3.80–5.20)
RDW: 15.1 % — AB (ref 11.5–14.5)
WBC: 15.9 10*3/uL — AB (ref 3.6–11.0)

## 2017-04-12 LAB — URINALYSIS, COMPLETE (UACMP) WITH MICROSCOPIC
Bacteria, UA: NONE SEEN
Bilirubin Urine: NEGATIVE
Glucose, UA: NEGATIVE mg/dL
Ketones, ur: NEGATIVE mg/dL
Leukocytes, UA: NEGATIVE
Nitrite: NEGATIVE
Protein, ur: NEGATIVE mg/dL
Specific Gravity, Urine: 1.013 (ref 1.005–1.030)
pH: 5 (ref 5.0–8.0)

## 2017-04-12 LAB — TROPONIN I: Troponin I: <0.03 ng/mL (ref <0.03)

## 2017-04-12 MED ORDER — SODIUM CHLORIDE 0.9 % IV BOLUS (SEPSIS)
500.0000 mL | Freq: Once | INTRAVENOUS | Status: AC
  Administered 2017-04-12: 500 mL via INTRAVENOUS

## 2017-04-12 MED ORDER — DIPHENHYDRAMINE HCL 50 MG/ML IJ SOLN: 50.0000 mg | Freq: Once | INTRAMUSCULAR | Status: DC

## 2017-04-12 NOTE — ED Notes (Signed)

## 2017-04-12 NOTE — Discharge Instructions (Addendum)
You have been seen today in the Emergency Department (ED)  for almost passing out.  Your workup including labs and EKG show reassuring results.  Your symptoms may be due to dehydration, so it is important that you drink plenty of non-alcoholic fluids.  Please call your regular doctor as soon as possible to schedule the next available clinic appointment to follow up with him/her regarding your visit to the ED and your symptoms.  Return to the Emergency Department (ED)  if you have any further syncopal episodes (pass out again) or develop ANY chest pain, pressure, tightness, trouble breathing, sudden sweating, or other symptoms that concern you.

## 2017-04-12 NOTE — ED Triage Notes (Signed)
Pt to ED reporting weakness, dizziness and syncope this afternoon. Family reports pt was last known well at 1345 and then had one episode of vomiting and one episode of diarrhea suddenly and then had a syncopal episode. PT was unable to talk upon arrival and needed to be helped out of car by ED staff. Pt is now alert and oriented x 4 and able to talk without difficulty. No weakness noted or neuro deficits noted at this time. Pt denies use of blood thinners.

## 2017-04-12 NOTE — ED Notes (Signed)
This RN noticed rash on pts right arm, by IV site. Antibiotic stopped, MD notified.

## 2017-04-12 NOTE — ED Provider Notes (Signed)
[facility] Emergency Department Provider Note   ____________________________________________   First MD Initiated Contact with Patient 04/12/17 1527     (approximate)  I have reviewed the triage vital signs and the nursing notes.   HISTORY  Chief Complaint Loss of Consciousness    HPI Alexis Whitney is a 83 y.o. female here for evaluation of nearlypassing out.  Patient reports she felt slightly lightheaded, she then had to go use the restroom and afterwards she stood up and felt very lightheaded as though she was to pass out. She had a single bowel movement proceeding. Denies bloody stool.  She was able to get outside and felt lightheaded, notified her former daughter-in-law lives next door who brought her to the hospital. On her way to the hospital the patient became more lightheaded, and it was reported that for a brief. She stopped talking and was thought to have passed out. At the present time, she reports she feels fine and she is not having any ongoing symptoms. Denies any numbness, headache, weakness, trouble speaking. No headache. No neck pain. No chest pain or trouble breathing. No palpitations. No abdominal pain or ongoing symptoms.  Reports she had a similar episode a few years ago, but does not know why occurred.patient and family reports she has not been drinking much water recently to anxiety regarding her sister's care needs. Patient does live with family   Past Medical History:  Diagnosis Date  . Arthritis   . GERD (gastroesophageal reflux disease)   . Hypertension     Patient Active Problem List   Diagnosis Date Noted  . Idiopathic hypertension 02/04/2016  . GERD without esophagitis 02/04/2016    Past Surgical History:  Procedure Laterality Date  . HERNIA REPAIR      Prior to Admission medications   Medication Sig Start Date End Date Taking? Authorizing Provider  lisinopril (PRINIVIL,ZESTRIL) 5 MG tablet Take 5 mg by mouth  daily.   Yes [provider]  pantoprazole (PROTONIX) 20 MG tablet Take 20 mg by mouth daily.   Yes [provider]    Allergies Influenza vaccines; Aspirin; Sulfa antibiotics; and Penicillins  Family History  Problem Relation Age of Onset  . Cancer Mother        Kidney  . Cancer Sister        Lung  . Bladder Cancer Neg Hx     Social History Social History  Substance Use Topics  . Smoking status: Never Smoker  . Smokeless tobacco: Never Used  . Alcohol use No    Review of Systems Constitutional: No fever/chills Eyes: No visual changes. ENT: No sore throat. Cardiovascular: Denies chest pain. Respiratory: Denies shortness of breath. Gastrointestinal: No abdominal pain.  vomited once, reports it was clear.  No diarrhea.  No constipation.no ongoing nausea. Genitourinary: Negative for dysuria. Musculoskeletal: Negative for back pain. Skin: Negative for rash. Neurological: Negative for headaches, focal weakness or numbness.    ____________________________________________   PHYSICAL EXAM:  VITAL SIGNS: ED Triage Vitals  Enc Vitals Group     BP 04/12/17 1506 (!) 150/73     Pulse Rate 04/12/17 1506 79     Resp 04/12/17 1506 10     Temp 04/12/17 1506 98.7 F (37.1 C)     Temp Source 04/12/17 1506 Oral     SpO2 04/12/17 1506 97 %     Weight 04/12/17 1514 109 lb (49.4 kg)     Height 04/12/17 1514  (1.6 m)  Head Circumference --      Peak Flow --      Pain Score --      Pain Loc --      Pain Edu? --      Excl. in GC? --     Constitutional: Alert and oriented. Well appearing and in no acute distress.she and her son are at the bedside both very pleasant. Eyes: Conjunctivae are normal. Head: Atraumatic. Nose: No congestion/rhinnorhea. Mouth/Throat: Mucous membranes are moist. Neck: No stridor.  no cervical tenderness Cardiovascular: Normal rate, regular rhythm. Grossly normal heart sounds.  Good peripheral circulation. Respiratory:  Normal respiratory effort.  No retractions. Lungs CTAB. Gastrointestinal: Soft and nontender. No distention. Musculoskeletal: No lower extremity tenderness nor edema. Neurologic:  Normal speech and language. No gross focal neurologic deficits are appreciated. no pronator drift. Cranial nerve exam normal. Normal sensation in 5 out of 5 strength with excellent strength in all extremities. Skin:  Skin is warm, dry and intact. No rash noted. Psychiatric: Mood and affect are normal. Speech and behavior are normal.  ____________________________________________   LABS (all labs ordered are listed, but only abnormal results are displayed)  Labs Reviewed  BASIC METABOLIC PANEL - Abnormal; Notable for the following:       Result Value   Glucose, Bld 170 (*)    BUN 21 (*)    Creatinine, Ser 1.13 (*)    GFR calc non Af Amer 42 (*)    GFR calc Af Amer 48 (*)    All other components within normal limits  CBC - Abnormal; Notable for the following:    WBC 15.9 (*)    RDW 15.1 (*)    All other components within normal limits  URINALYSIS, COMPLETE (UACMP) WITH MICROSCOPIC - Abnormal; Notable for the following:    Color, Urine YELLOW (*)    APPearance CLEAR (*)    Hgb urine dipstick MODERATE (*)    Squamous Epithelial / LPF 0-5 (*)    All other components within normal limits  TROPONIN I  CBG MONITORING, ED   ____________________________________________  EKG  reviewed and interpreted by me at 1532 Heart rate 65 QRS 80 QTc 420 Normal sinus rhythm, occasional PAC ____________________________________________  RADIOLOGY    no evidence of trauma, no strokelike symptoms. Patient fully recovered with reassuring examination at this time. Denies neurologic symptoms. No acute indication for head CT noted at this time  ____________________________________________   PROCEDURES  Procedure(s) performed: None  Procedures  Critical Care performed:  No  ____________________________________________   INITIAL IMPRESSION / ASSESSMENT AND PLAN / ED COURSE  Pertinent labs & imaging results that were available during my care of the patient were reviewed by me and considered in my medical decision making (see chart for details).  patient visits for evaluation of a near syncopal episode. Occurred shortly after a bowel movement and feeling lightheaded with one episode of vomiting. No ongoing abdominal pain or discomfort. Reassuring examination.  labs reviewed, creatinine elevated from baseline with decreased GFR. I suspect this may be prerenal in nature, possibly some dehydration as her family and the patient reports to be under significant stress and decreased appetite potentially due to having to care for her sister    ----------------------------------------- 7:25 PM on 04/12/2017 -----------------------------------------  Patient able to ambulate well. Reports no ongoing lightheadedness or symptoms. Awake and alert. Family at bedside, her daughter will be staying with her at this evening. She reports no concerns. Return precautions and treatment recommendations and follow-up discussed with  the patient who is agreeable with the plan.  there is stable and appropriate for discharge. Suspect likely vasovagal syncope, likely on top of mild dehydration  ____________________________________________   FINAL CLINICAL IMPRESSION(S) / ED DIAGNOSES  Final diagnoses:  Near syncope      NEW MEDICATIONS STARTED DURING THIS VISIT:  New Prescriptions   No medications on file     Note:  This document was prepared using Dragon voice recognition software and may include unintentional dictation errors.     Sharyn Creamer, MD 04/12/17 [phone]

## 2017-04-12 NOTE — ED Notes (Signed)
Pt ambulated with assistance at this time, pt denies any dizziness or lightheadedness.  Pt in NAD . MD made aware

## 2017-04-14 ENCOUNTER — Emergency Department: Payer: Medicare Other

## 2017-04-14 ENCOUNTER — Emergency Department
Admission: EM | Admit: 2017-04-14 | Discharge: 2017-04-14 | Disposition: A | Discharge status: Home / Self Care | Payer: Medicare Other | Attending: Emergency Medicine | Admitting: Emergency Medicine

## 2017-04-14 DIAGNOSIS — I10 Essential (primary) hypertension: Secondary | ICD-10-CM | POA: Insufficient documentation

## 2017-04-14 DIAGNOSIS — N390 Urinary tract infection, site not specified: Secondary | ICD-10-CM | POA: Diagnosis not present

## 2017-04-14 DIAGNOSIS — Z79899 Other long term (current) drug therapy: Secondary | ICD-10-CM | POA: Diagnosis not present

## 2017-04-14 DIAGNOSIS — R1010 Upper abdominal pain, unspecified: Secondary | ICD-10-CM

## 2017-04-14 LAB — CBC WITH DIFFERENTIAL/PLATELET
BAND NEUTROPHILS: 0 %
BASOS PCT: 0 %
Basophils Absolute: 0 10*3/uL (ref 0–0.1)
Blasts: 0 %
EOS ABS: 0.2 10*3/uL (ref 0–0.7)
EOS PCT: 1 %
HCT: 37.3 % (ref 35.0–47.0)
HEMOGLOBIN: 12.5 g/dL (ref 12.0–16.0)
LYMPHS ABS: 14.1 10*3/uL — AB (ref 1.0–3.6)
Lymphocytes Relative: 67 %
MCH: 28.1 pg (ref 26.0–34.0)
MCHC: 33.4 g/dL (ref 32.0–36.0)
MCV: 84.2 fL (ref 80.0–100.0)
METAMYELOCYTES PCT: 0 %
MONO ABS: 1.1 10*3/uL — AB (ref 0.2–0.9)
MONOS PCT: 5 %
MYELOCYTES: 0 %
NEUTROS ABS: 5.7 10*3/uL (ref 1.4–6.5)
Neutrophils Relative %: 27 %
Other: 0 %
PLATELETS: 287 10*3/uL (ref 150–440)
Promyelocytes Absolute: 0 %
RBC: 4.43 MIL/uL (ref 3.80–5.20)
RDW: 14.8 % — ABNORMAL HIGH (ref 11.5–14.5)
WBC: 21.1 10*3/uL — AB (ref 3.6–11.0)
nRBC: 0 /100 WBC

## 2017-04-14 LAB — COMPREHENSIVE METABOLIC PANEL
ALBUMIN: 3.7 g/dL (ref 3.5–5.0)
ALT: 9 U/L — AB (ref 14–54)
AST: 21 U/L (ref 15–41)
Alkaline Phosphatase: 69 U/L (ref 38–126)
Anion gap: 9 (ref 5–15)
BUN: 16 mg/dL (ref 6–20)
CHLORIDE: 101 mmol/L (ref 101–111)
CO2: 27 mmol/L (ref 22–32)
CREATININE: 0.79 mg/dL (ref 0.44–1.00)
Calcium: 8.9 mg/dL (ref 8.9–10.3)
GFR calc Af Amer: >60 mL/min (ref >60)
GLUCOSE: 89 mg/dL (ref 65–99)
Potassium: 3.2 mmol/L — ABNORMAL LOW (ref 3.5–5.1)
Sodium: 137 mmol/L (ref 135–145)
Total Bilirubin: 0.8 mg/dL (ref 0.3–1.2)
Total Protein: 6.9 g/dL (ref 6.5–8.1)

## 2017-04-14 LAB — URINALYSIS, COMPLETE (UACMP) WITH MICROSCOPIC
BILIRUBIN URINE: NEGATIVE
Bacteria, UA: NONE SEEN
GLUCOSE, UA: NEGATIVE mg/dL
Ketones, ur: NEGATIVE mg/dL
NITRITE: NEGATIVE
PH: 5 (ref 5.0–8.0)
PROTEIN: NEGATIVE mg/dL
SPECIFIC GRAVITY, URINE: 1.009 (ref 1.005–1.030)

## 2017-04-14 LAB — INFLUENZA PANEL BY PCR (TYPE A & B)
INFLAPCR: NEGATIVE
INFLBPCR: NEGATIVE

## 2017-04-14 LAB — LIPASE, BLOOD: LIPASE: 28 U/L (ref 11–51)

## 2017-04-14 LAB — LACTIC ACID, PLASMA: LACTIC ACID, VENOUS: 1.5 mmol/L (ref 0.5–1.9)

## 2017-04-14 LAB — TROPONIN I

## 2017-04-14 MED ORDER — IOPAMIDOL (ISOVUE-300) INJECTION 61%
75.0000 mL | Freq: Once | INTRAVENOUS | Status: AC | PRN
  Administered 2017-04-14: 75 mL via INTRAVENOUS

## 2017-04-14 MED ORDER — IOPAMIDOL (ISOVUE-300) INJECTION 61%
30.0000 mL | Freq: Once | INTRAVENOUS | Status: AC
  Administered 2017-04-14: 30 mL via ORAL

## 2017-04-14 MED ORDER — CEPHALEXIN 500 MG PO CAPS: 500.0000 mg | ORAL_CAPSULE | Freq: Two times a day (BID) | ORAL | 0 refills | Status: AC

## 2017-04-14 MED ORDER — CEPHALEXIN 500 MG PO CAPS
500.0000 mg | ORAL_CAPSULE | Freq: Once | ORAL | Status: AC
  Administered 2017-04-14: 500 mg via ORAL
  Filled 2017-04-14: qty 1

## 2017-04-14 MED ORDER — SODIUM CHLORIDE 0.9 % IV BOLUS (SEPSIS)
500.0000 mL | Freq: Once | INTRAVENOUS | Status: AC
  Administered 2017-04-14: 500 mL via INTRAVENOUS

## 2017-04-14 NOTE — ED Provider Notes (Signed)
[facility] Emergency Department Provider Note ____________________________________________   First MD Initiated Contact with Patient 04/14/17 1613     (approximate)  I have reviewed the triage vital signs and the nursing notes.   HISTORY  Chief Complaint Abdominal Pain (sepsis?)    HPI Alexis Whitney is a 83 y.o. female With past medical history of arthritis hypertension and GERD who presents with generalized weakness for the last several days, gradual onset, persistent course, and associated with upper and mid abdominal "soreness."   Patient states that she developed the weakness over the weekend and syncopized, which resulted in her coming to the emergency department. Patient was seen by her PMD today who noted high white blood cell count and was concern for infection or sepsis. Patient denies any other focal symptoms besides the abdominal discomfort.  Past Medical History:  Diagnosis Date  . Arthritis   . GERD (gastroesophageal reflux disease)   . Hypertension     Patient Active Problem List   Diagnosis Date Noted  . Idiopathic hypertension 02/04/2016  . GERD without esophagitis 02/04/2016    Past Surgical History:  Procedure Laterality Date  . HERNIA REPAIR      Prior to Admission medications   Medication Sig Start Date End Date Taking? Authorizing Provider  lisinopril (PRINIVIL,ZESTRIL) 5 MG tablet Take 5 mg by mouth daily.    [provider]  pantoprazole (PROTONIX) 20 MG tablet Take 20 mg by mouth daily.    [provider]    Allergies Influenza vaccines; Aspirin; Sulfa antibiotics; and Penicillins  Family History  Problem Relation Age of Onset  . Cancer Mother        Kidney  . Cancer Sister        Lung  . Bladder Cancer Neg Hx     Social History Social History  Substance Use Topics  . Smoking status: Never Smoker  . Smokeless tobacco: Never Used  . Alcohol use No    Review of Systems  Constitutional: No  fever. Eyes: Noredness. ENT: No sore throat. Cardiovascular: Denies chest pain. Respiratory: Denies shortness of breath. Gastrointestinal: Positive for upper abdominal discomfort. No nausea, no vomiting.  No diarrhea.  Genitourinary: Negative for dysuria.  Musculoskeletal: Negative for back pain. Skin: Negative for rash. Neurological: Negative for headache.    ____________________________________________   PHYSICAL EXAM:  VITAL SIGNS: ED Triage Vitals  Enc Vitals Group     BP 04/14/17 1512 121/61     Pulse Rate 04/14/17 1512 86     Resp 04/14/17 1512 16     Temp 04/14/17 1512 98.2 F (36.8 C)     Temp Source 04/14/17 1512 Oral     SpO2 04/14/17 1512 98 %     Weight 04/14/17 1512 109 lb (49.4 kg)     Height 04/14/17 1512  (1.6 m)     Head Circumference --      Peak Flow --      Pain Score 04/14/17 1511 3     Pain Loc --      Pain Edu? --      Excl. in GC? --     Constitutional: Alert and oriented. Well appearing and in no acute distress. Eyes: Conjunctivae are normal.  Head: Atraumatic. Nose: No congestion/rhinnorhea. Mouth/Throat: Mucous membranes are moist.   Neck: Normal range of motion.  Cardiovascular: Normal rate, regular rhythm. Grossly normal heart sounds.  Good peripheral circulation. Respiratory: Normal respiratory effort.  No retractions. Lungs CTAB. Gastrointestinal: Soft  and nontender. No distention.  Genitourinary: No CVA tenderness. Musculoskeletal: No lower extremity edema.  Extremities warm and well perfused.  Neurologic:  Normal speech and language. No gross focal neurologic deficits are appreciated.  Skin:  Skin is warm and dry. No rash noted. Psychiatric: Mood and affect are normal. Speech and behavior are normal.  ____________________________________________   LABS (all labs ordered are listed, but only abnormal results are displayed)  Labs Reviewed  COMPREHENSIVE METABOLIC PANEL - Abnormal; Notable for the following:       Result  Value   Potassium 3.2 (*)    ALT 9 (*)    All other components within normal limits  CBC WITH DIFFERENTIAL/PLATELET - Abnormal; Notable for the following:    WBC 21.1 (*)    RDW 14.8 (*)    Lymphs Abs 14.1 (*)    Monocytes Absolute 1.1 (*)    All other components within normal limits  URINALYSIS, COMPLETE (UACMP) WITH MICROSCOPIC - Abnormal; Notable for the following:    Color, Urine YELLOW (*)    APPearance CLEAR (*)    Hgb urine dipstick MODERATE (*)    Leukocytes, UA SMALL (*)    Squamous Epithelial / LPF 0-5 (*)    All other components within normal limits  LACTIC ACID, PLASMA  TROPONIN I  INFLUENZA PANEL BY PCR (TYPE A & B)  LIPASE, BLOOD  LACTIC ACID, PLASMA   ____________________________________________  EKG  ED ECG REPORT I, Dionne Bucy, the attending physician, personally viewed and interpreted this ECG.  Date: 04/14/2017 EKG Time: 1651 Rate: 74 Rhythm: normal sinus rhythm QRS Axis: normal Intervals: normal ST/T Wave abnormalities: normal Narrative Interpretation: no evidence of acute ischemia  ____________________________________________  RADIOLOGY  CT abd: mild stranding around pancreatic head, and focal duct dilatation  CXR: no focal infiltrate ____________________________________________   PROCEDURES  Procedure(s) performed: No    Critical Care performed: No ____________________________________________   INITIAL IMPRESSION / ASSESSMENT AND PLAN / ED COURSE  Pertinent labs & imaging results that were available during my care of the patient were reviewed by me and considered in my medical decision making (see chart for details).  83 year old female with past medical history as noted presents with generalized weakness for several days along with upper abdominal discomfort, sent in by her primary care doctor with concern for sepsis or infection due to elevated white blood cell count. On review past medical history in Epic, patient had a  visit 2 days ago for syncope, which she states was around the onset of this generalized weakness. Past records otherwise noncontributory.  On exam the emergency department, patient's vital signs are normal, she is relatively well-appearing for age, and the exam is unremarkable except for mild upper abdominal discomfort to palpation but no focal tenderness. She does have elevated WBC count, and chest x-ray is negative.  Differential includes flu or other viral syndrome, UTI, or intra-abdominal infection.  There is no evidence for sepsis at this time. Plan: CT abdomen, UA, IV fluids, and reassess.    ----------------------------------------- 9:34 PM on 04/14/2017 -----------------------------------------  Patient has remained comfortable in emergency department with no significant pain and normal vital signs.  UA shows WBCs and RBCs as well as leukocytes, which is consistent with UTI as likely source of patient's weakness and elevated WBC count. Flu test is negative and lab workup is otherwise unremarkable. CT abdomen shows nonspecific mild stranding around the pancreas as well as some pancreatic duct dilatation. Given that the patient has no elevated LFTs or  jaundice, I am low suspicion for an acute pancreatic process. Lipase obtained and is normal, so there is no evidence of acute pancreatitis.  patient feels well to go home. I had a long discussion with patient and family members about the results of her workup. We will give antibiotics for likely UTI, and patient was informed about the pancreatic findings on the CT and instructed to follow-up with her primary care doctor this week. Patient also given thorough return precautions and instructed to return for any new or worsening symptoms. The patient expressed understanding of these instructions.   ____________________________________________   FINAL CLINICAL IMPRESSION(S) / ED DIAGNOSES  Final diagnoses:  Urinary tract infection without hematuria,  site unspecified  Pain of upper abdomen      NEW MEDICATIONS STARTED DURING THIS VISIT:  New Prescriptions   No medications on file     Note:  This document was prepared using Dragon voice recognition software and may include unintentional dictation errors.    Dionne Bucy, MD 04/14/17 2137

## 2017-04-14 NOTE — ED Triage Notes (Signed)
Pt was sent from PCP from a follow up apt. Pt was seen her on Sunday dx with gastritis. States she was having weakness, dizziness, syncope with abd pain and diarrhea. States now only having generalized weakness with abd tenderness,. PCP reports pt WBC increased since Sunday and is concerned about possible sepsis.Marland Kitchen

## 2017-04-14 NOTE — Discharge Instructions (Signed)
Take the antibiotic as prescribed and finish the full course. Follow up with your doctor this week. You should inform your doctor about the findings of the CT scan, which include stranding around the pancreas and dilation of the duct of the pancreas. You may need an MRI or other tests as an outpatient to further evaluate this.  Return to the ER immediately for new or worsening pain, weakness, fevers, dizziness, inability to tolerate anything by mouth, vomiting, or any other new or worsening symptoms that concern you.

## 2017-04-14 NOTE — ED Notes (Signed)
Return from CT.  AAOx3.  Skin warm and dry. NAD 

## 2017-04-14 NOTE — ED Notes (Signed)
Patient transported to CT 

## 2017-04-15 ENCOUNTER — Telehealth: Payer: Self-pay | Admitting: Emergency Medicine

## 2017-04-15 NOTE — Telephone Encounter (Signed)
Pharmacy called to clarify the keflex rx.  Say patient has throat swelling with penicillin and want to verify before filling.  Per dr Don Perkingveronese can fill the keflex and instruct patient to stop if and return for worse reaction.

## 2017-08-01 ENCOUNTER — Other Ambulatory Visit: Payer: Self-pay

## 2017-08-01 ENCOUNTER — Encounter: Payer: Self-pay | Admitting: Emergency Medicine

## 2017-08-01 ENCOUNTER — Emergency Department
Admission: EM | Admit: 2017-08-01 | Discharge: 2017-08-01 | Disposition: A | Discharge status: Home / Self Care | Payer: Medicare Other | Attending: Emergency Medicine | Admitting: Emergency Medicine

## 2017-08-01 ENCOUNTER — Emergency Department: Payer: Medicare Other

## 2017-08-01 DIAGNOSIS — Z79899 Other long term (current) drug therapy: Secondary | ICD-10-CM | POA: Insufficient documentation

## 2017-08-01 DIAGNOSIS — R42 Dizziness and giddiness: Secondary | ICD-10-CM | POA: Insufficient documentation

## 2017-08-01 DIAGNOSIS — I10 Essential (primary) hypertension: Secondary | ICD-10-CM | POA: Diagnosis not present

## 2017-08-01 DIAGNOSIS — H8110 Benign paroxysmal vertigo, unspecified ear: Secondary | ICD-10-CM

## 2017-08-01 LAB — URINALYSIS, ROUTINE W REFLEX MICROSCOPIC
BILIRUBIN URINE: NEGATIVE
Bacteria, UA: NONE SEEN
Glucose, UA: NEGATIVE mg/dL
Ketones, ur: NEGATIVE mg/dL
Leukocytes, UA: NEGATIVE
NITRITE: NEGATIVE
PROTEIN: NEGATIVE mg/dL
Specific Gravity, Urine: 1.006 (ref 1.005–1.030)
pH: 6 (ref 5.0–8.0)

## 2017-08-01 LAB — CBC
HCT: 41 % (ref 35.0–47.0)
Hemoglobin: 13.5 g/dL (ref 12.0–16.0)
MCH: 27.1 pg (ref 26.0–34.0)
MCHC: 32.9 g/dL (ref 32.0–36.0)
MCV: 82.3 fL (ref 80.0–100.0)
PLATELETS: 384 10*3/uL (ref 150–440)
RBC: 4.98 MIL/uL (ref 3.80–5.20)
RDW: 14.6 % — ABNORMAL HIGH (ref 11.5–14.5)
WBC: 9.5 10*3/uL (ref 3.6–11.0)

## 2017-08-01 LAB — COMPREHENSIVE METABOLIC PANEL
ALT: 9 U/L — ABNORMAL LOW (ref 14–54)
ANION GAP: 11 (ref 5–15)
AST: 22 U/L (ref 15–41)
Albumin: 3.9 g/dL (ref 3.5–5.0)
Alkaline Phosphatase: 97 U/L (ref 38–126)
BUN: 16 mg/dL (ref 6–20)
CHLORIDE: 105 mmol/L (ref 101–111)
CO2: 27 mmol/L (ref 22–32)
Calcium: 9.3 mg/dL (ref 8.9–10.3)
Creatinine, Ser: 0.8 mg/dL (ref 0.44–1.00)
GFR calc non Af Amer: >60 mL/min (ref >60)
Glucose, Bld: 109 mg/dL — ABNORMAL HIGH (ref 65–99)
Potassium: 3.8 mmol/L (ref 3.5–5.1)
SODIUM: 143 mmol/L (ref 135–145)
Total Bilirubin: 0.7 mg/dL (ref 0.3–1.2)
Total Protein: 7.6 g/dL (ref 6.5–8.1)

## 2017-08-01 LAB — TROPONIN I: Troponin I: <0.03 ng/mL (ref <0.03)

## 2017-08-01 MED ORDER — MECLIZINE HCL 12.5 MG PO TABS: 12.5000 mg | ORAL_TABLET | Freq: Two times a day (BID) | ORAL | 0 refills | Status: AC | PRN

## 2017-08-01 MED ORDER — LISINOPRIL 5 MG PO TABS
5.0000 mg | ORAL_TABLET | Freq: Once | ORAL | Status: AC
  Administered 2017-08-01: 5 mg via ORAL
  Filled 2017-08-01: qty 1

## 2017-08-01 NOTE — ED Triage Notes (Signed)
Pt feels like room is "wavy" but only when sits or stands. Denies any pain at all.  Feels normal sitting in bed. From home. Has not taken daily meds yet.

## 2017-08-01 NOTE — ED Provider Notes (Signed)
[facility] Emergency Department Provider Note  Time seen: 8:57 AM  I have reviewed the triage vital signs and the nursing notes.   HISTORY  Chief Complaint Dizziness    HPI Alexis Whitney is a 84 y.o. female with a past medical history of arthritis, gastric reflux, hypertension, presents to the emergency department for dizziness.  According to the patient since this morning when she sits up she will feel a spinning sensation.  States as long as she lies down she is symptom-free.  Patient has not yet taken her normal blood pressure medications today and is found to be somewhat hypertensive.  Patient denies any chest pain, headache, weakness or numbness.  Denies any history of vertigo in the past.  Denies any infectious symptoms such as cough, congestion, fever or dysuria.   Past Medical History:  Diagnosis Date  . Arthritis   . GERD (gastroesophageal reflux disease)   . Hypertension     Patient Active Problem List   Diagnosis Date Noted  . Idiopathic hypertension 02/04/2016  . GERD without esophagitis 02/04/2016    Past Surgical History:  Procedure Laterality Date  . HERNIA REPAIR      Prior to Admission medications   Medication Sig Start Date End Date Taking? Authorizing Provider  lisinopril (PRINIVIL,ZESTRIL) 5 MG tablet Take 5 mg by mouth daily.    [provider]  pantoprazole (PROTONIX) 20 MG tablet Take 20 mg by mouth daily.    [provider]    Allergies  Allergen Reactions  . Influenza Vaccines Anaphylaxis    Flu vaccine- causes anaphylaxis  . Penicillins Anaphylaxis    Has patient had a PCN reaction causing immediate rash, facial/tongue/throat swelling, SOB or lightheadedness with hypotension: Yes Has patient had a PCN reaction causing severe rash involving mucus membranes or skin necrosis: No Has patient had a PCN reaction that required hospitalization No Has patient had a PCN reaction occurring within the last 10  years: No If all of the above answers are "NO", then may proceed with Cephalosporin use.   . Aspirin Other (See Comments)    "makes me feel bad"  . Sulfa Antibiotics Other (See Comments)    unknown    Family History  Problem Relation Age of Onset  . Cancer Mother        Kidney  . Cancer Sister        Lung  . Bladder Cancer Neg Hx     Social History Social History   Tobacco Use  . Smoking status: Never Smoker  . Smokeless tobacco: Never Used  Substance Use Topics  . Alcohol use: No  . Drug use: No    Review of Systems Constitutional: Negative for fever. Eyes: Spinning/dizzy sensation when sitting up. ENT: Negative for ear pain or decreased hearing. Cardiovascular: Negative for chest pain. Respiratory: Negative for shortness of breath. Gastrointestinal: Negative for abdominal pain, vomiting and diarrhea. Genitourinary: Negative for urinary compaints Musculoskeletal: Negative for musculoskeletal complaints Skin: Negative for skin complaints  Neurological: Negative for headache, weakness or numbness. All other ROS negative  ____________________________________________   PHYSICAL EXAM:  VITAL SIGNS: ED Triage Vitals  Enc Vitals Group     BP 08/01/17 0849 (!) 203/76     Pulse Rate 08/01/17 0849 77     Resp 08/01/17 0849 16     Temp 08/01/17 0849 98.2 F (36.8 C)     Temp Source 08/01/17 0849 Oral     SpO2 08/01/17 0849 100 %  Weight 08/01/17 0847 108 lb (49 kg)     Height 08/01/17 0847 5\' 4"  (1.626 m)     Head Circumference --      Peak Flow --      Pain Score --      Pain Loc --      Pain Edu? --      Excl. in GC? --    Constitutional: Alert and oriented. Well appearing and in no distress.  Appears younger than stated age. Eyes: Normal exam ENT   Head: Normocephalic and atraumatic.   Mouth/Throat: Mucous membranes are moist. Cardiovascular: Normal rate, regular rhythm.  Respiratory: Normal respiratory effort without tachypnea nor  retractions. Breath sounds are clear  Gastrointestinal: Soft and nontender. No distention. Musculoskeletal: Nontender with normal range of motion in all extremities. No lower extremity tenderness or edema. Neurologic:  Normal speech and language. No gross focal neurologic deficits Skin:  Skin is warm, dry and intact.  Psychiatric: Mood and affect are normal. Speech and behavior are normal.   ____________________________________________    EKG  EKG reviewed and interpreted by myself shows normal sinus rhythm at 62 bpm with a narrow QRS, normal axis, normal intervals, no concerning ST changes.  ____________________________________________    RADIOLOGY  CT scan of the head is normal.  ____________________________________________   INITIAL IMPRESSION / ASSESSMENT AND PLAN / ED COURSE  Pertinent labs & imaging results that were available during my care of the patient were reviewed by me and considered in my medical decision making (see chart for details).  Patient presents to the emergency department for dizziness sensation which she describes as a spinning sensation only when she sits up.  Denies any nausea, vomiting, weakness or numbness, headache, slurred speech confusion, chest pain.  Overall she appears very well denies any symptoms at this time while lying in bed.  Patient is quite hypertensive 203/76.  We will dose the patient's normal blood pressure medications.  We will check labs including cardiac panel.  Given no history of vertigo in the past will obtain a CT scan of the head.  Orthostatic vitals by EMS are normal.  CT scan of the head is normal.  Labs are largely within normal limits.  Blood pressure has decreased now 152/65 after receiving only her normal morning blood pressure medication.  Patient denies any symptoms at this time.  Symptoms she is experiencing appear to be most suggestive of BPPV.  We will prescribe a low-dose of meclizine and have the patient follow-up with  ENT.  Discussed this with the patient in her son, they are agreeable to this plan of care I also discussed stroke return precautions.  ____________________________________________   FINAL CLINICAL IMPRESSION(S) / ED DIAGNOSES  Dizziness Vertigo   Minna Antis, MD 08/01/17 1001

## 2017-08-01 NOTE — ED Notes (Signed)
Patient transported to CT 

## 2018-11-08 ENCOUNTER — Emergency Department
Admission: EM | Admit: 2018-11-08 | Discharge: 2018-11-08 | Disposition: A | Discharge status: Home / Self Care | Payer: Medicare Other | Attending: Emergency Medicine | Admitting: Emergency Medicine

## 2018-11-08 ENCOUNTER — Emergency Department: Payer: Medicare Other

## 2018-11-08 ENCOUNTER — Other Ambulatory Visit: Payer: Self-pay

## 2018-11-08 DIAGNOSIS — S4991XA Unspecified injury of right shoulder and upper arm, initial encounter: Secondary | ICD-10-CM | POA: Diagnosis not present

## 2018-11-08 DIAGNOSIS — W010XXA Fall on same level from slipping, tripping and stumbling without subsequent striking against object, initial encounter: Secondary | ICD-10-CM | POA: Diagnosis not present

## 2018-11-08 DIAGNOSIS — Y998 Other external cause status: Secondary | ICD-10-CM | POA: Diagnosis not present

## 2018-11-08 DIAGNOSIS — Y9301 Activity, walking, marching and hiking: Secondary | ICD-10-CM | POA: Insufficient documentation

## 2018-11-08 DIAGNOSIS — S4990XA Unspecified injury of shoulder and upper arm, unspecified arm, initial encounter: Secondary | ICD-10-CM

## 2018-11-08 DIAGNOSIS — I10 Essential (primary) hypertension: Secondary | ICD-10-CM | POA: Insufficient documentation

## 2018-11-08 DIAGNOSIS — Y9289 Other specified places as the place of occurrence of the external cause: Secondary | ICD-10-CM | POA: Diagnosis not present

## 2018-11-08 DIAGNOSIS — Z79899 Other long term (current) drug therapy: Secondary | ICD-10-CM | POA: Diagnosis not present

## 2018-11-08 LAB — TROPONIN I: Troponin I: <0.03 ng/mL (ref <0.03)

## 2018-11-08 LAB — CBC WITH DIFFERENTIAL/PLATELET
Abs Immature Granulocytes: 0.03 10*3/uL (ref 0.00–0.07)
Basophils Absolute: 0.1 10*3/uL (ref 0.0–0.1)
Basophils Relative: 1 %
Eosinophils Absolute: 0.1 10*3/uL (ref 0.0–0.5)
Eosinophils Relative: 1 %
HCT: 40.4 % (ref 36.0–46.0)
Hemoglobin: 13.2 g/dL (ref 12.0–15.0)
Immature Granulocytes: 0 %
Lymphocytes Relative: 66 %
Lymphs Abs: 9.5 10*3/uL — ABNORMAL HIGH (ref 0.7–4.0)
MCH: 27.3 pg (ref 26.0–34.0)
MCHC: 32.7 g/dL (ref 30.0–36.0)
MCV: 83.6 fL (ref 80.0–100.0)
Monocytes Absolute: 0.8 10*3/uL (ref 0.1–1.0)
Monocytes Relative: 6 %
Neutro Abs: 3.7 10*3/uL (ref 1.7–7.7)
Neutrophils Relative %: 26 %
Platelets: 285 10*3/uL (ref 150–400)
RBC: 4.83 MIL/uL (ref 3.87–5.11)
RDW: 14.3 % (ref 11.5–15.5)
Smear Review: NORMAL
WBC: 14.2 10*3/uL — ABNORMAL HIGH (ref 4.0–10.5)
nRBC: 0 % (ref 0.0–0.2)

## 2018-11-08 LAB — COMPREHENSIVE METABOLIC PANEL
ALT: 10 U/L (ref 0–44)
AST: 20 U/L (ref 15–41)
Albumin: 3.9 g/dL (ref 3.5–5.0)
Alkaline Phosphatase: 85 U/L (ref 38–126)
Anion gap: 9 (ref 5–15)
BUN: 19 mg/dL (ref 8–23)
CO2: 27 mmol/L (ref 22–32)
Calcium: 8.9 mg/dL (ref 8.9–10.3)
Chloride: 106 mmol/L (ref 98–111)
Creatinine, Ser: 0.9 mg/dL (ref 0.44–1.00)
GFR calc Af Amer: >60 mL/min (ref >60)
GFR calc non Af Amer: 56 mL/min — ABNORMAL LOW (ref >60)
Glucose, Bld: 121 mg/dL — ABNORMAL HIGH (ref 70–99)
Potassium: 4 mmol/L (ref 3.5–5.1)
Sodium: 142 mmol/L (ref 135–145)
Total Bilirubin: 0.7 mg/dL (ref 0.3–1.2)
Total Protein: 7.2 g/dL (ref 6.5–8.1)

## 2018-11-08 NOTE — ED Provider Notes (Signed)
[facility] Emergency Department Provider Note   ____________________________________________    I have reviewed the triage vital signs and the nursing notes.   HISTORY  Chief Complaint Fall injury   HPI Alexis Whitney is a 85 y.o. female who presents after a fall.  Patient reports that she was carrying her dog to get the newspaper and must of tripped on something and fell onto her right shoulder.  She describes pain along the proximal aspect of her right arm, she reports is more painful to try to ABduct her right arm.  No head injury.  No chest wall pain.  No back pain or neck pain.  Past Medical History:  Diagnosis Date  . Arthritis   . GERD (gastroesophageal reflux disease)   . Hypertension     Patient Active Problem List   Diagnosis Date Noted  . Idiopathic hypertension 02/04/2016  . GERD without esophagitis 02/04/2016    Past Surgical History:  Procedure Laterality Date  . HERNIA REPAIR      Prior to Admission medications   Medication Sig Start Date End Date Taking? Authorizing Provider  lisinopril (PRINIVIL,ZESTRIL) 5 MG tablet Take 5 mg by mouth daily.   Yes [provider]  traZODone (DESYREL) 50 MG tablet TAKE 1 4 (ONE FOURTH) TABLET BY MOUTH AT BEDTIME AS NEEDED 10/21/18  Yes [provider]     Allergies Influenza vaccines; Penicillins; Aspirin; and Sulfa antibiotics  Family History  Problem Relation Age of Onset  . Cancer Mother        Kidney  . Cancer Sister        Lung  . Bladder Cancer Neg Hx     Social History Social History   Tobacco Use  . Smoking status: Never Smoker  . Smokeless tobacco: Never Used  Substance Use Topics  . Alcohol use: No  . Drug use: No    Review of Systems  Constitutional: Denies dizziness at this time Eyes: No visual changes.  ENT: No neck pain Cardiovascular: Denies chest pain. Respiratory: Denies shortness of breath. Gastrointestinal: No abdominal pain.    Genitourinary: No groin injury Musculoskeletal: As above Skin: Negative for rash. Neurological: Negative for headaches   ____________________________________________   PHYSICAL EXAM:  VITAL SIGNS: ED Triage Vitals  Enc Vitals Group     BP 11/08/18 1109 (!) 189/66     Pulse Rate 11/08/18 1109 66     Resp 11/08/18 1109 16     Temp 11/08/18 1109 98.1 F (36.7 C)     Temp Source 11/08/18 1109 Oral     SpO2 11/08/18 1109 99 %     Weight 11/08/18 1057 50.8 kg (112 lb)     Height 11/08/18 1057 1.6 m (5\' 3" )     Head Circumference --      Peak Flow --      Pain Score 11/08/18 1057 4     Pain Loc --      Pain Edu? --      Excl. in GC? --     Constitutional: Alert and oriented. Eyes: Conjunctivae are normal.   Nose: No congestion/rhinnorhea. Mouth/Throat: Mucous membranes are moist.   Neck:  Painless ROM, no vertebral tenderness palpation Cardiovascular: Normal rate, regular rhythm.   Good peripheral circulation.  No chest wall tenderness palpation Respiratory: Normal respiratory effort.  No retractions. Lungs CTAB. Gastrointestinal: Soft and nontender. No distention.   Musculoskeletal: Right upper extremity, no bony normalities, no significant bruising or swelling.  Pain with abduction, no chest wall tenderness palpation, 2+ distal pulses in the upper extremities Neurologic:  Normal speech and language. No gross focal neurologic deficits are appreciated.  Skin:  Skin is warm, dry and intact. No rash noted. Psychiatric: Mood and affect are normal. Speech and behavior are normal.  ____________________________________________   LABS (all labs ordered are listed, but only abnormal results are displayed)  Labs Reviewed  CBC WITH DIFFERENTIAL/PLATELET - Abnormal; Notable for the following components:      Result Value   WBC 14.2 (*)    Lymphs Abs 9.5 (*)    All other components within normal limits  COMPREHENSIVE METABOLIC PANEL - Abnormal; Notable for the following  components:   Glucose, Bld 121 (*)    GFR calc non Af Amer 56 (*)    All other components within normal limits  TROPONIN I  URINALYSIS, COMPLETE (UACMP) WITH MICROSCOPIC   ____________________________________________  EKG  ED ECG REPORT I, Jene Every, the attending physician, personally viewed and interpreted this ECG.  Date: 11/08/2018  Rhythm: normal sinus rhythm QRS Axis: normal Intervals: normal ST/T Wave abnormalities: normal Narrative Interpretation: no evidence of acute ischemia  ____________________________________________  RADIOLOGY  Chest x-ray negative, shoulder x-ray negative ____________________________________________   PROCEDURES  Procedure(s) performed: No  Procedures   Critical Care performed: No ____________________________________________   INITIAL IMPRESSION / ASSESSMENT AND PLAN / ED COURSE  Pertinent labs & imaging results that were available during my care of the patient were reviewed by me and considered in my medical decision making (see chart for details).  Patient presents for mechanical fall, lab work is overall reassuring, she has no dizziness here in the emergency department.  She denies LOC.  Shoulder x-rays unremarkable, exam is most suspicious for sprain, will place her in a sling and have her follow-up closely with orthopedics, recommend ice, Tylenol for pain.    ____________________________________________   FINAL CLINICAL IMPRESSION(S) / ED DIAGNOSES  Final diagnoses:  Shoulder injury, initial encounter        Note:  This document was prepared using Dragon voice recognition software and may include unintentional dictation errors.   Jene Every, MD 11/08/18 1319

## 2018-11-08 NOTE — ED Triage Notes (Signed)
Pt c/o having dizziness for the past 2 days, this morning lost her balance and fell, pt is c/o right shoulder pain.

## 2018-11-08 NOTE — ED Notes (Signed)
Iv removed from left wrist area.  Site clear.

## 2019-05-03 ENCOUNTER — Emergency Department
Admission: EM | Admit: 2019-05-03 | Discharge: 2019-05-04 | Disposition: A | Discharge status: Home / Self Care | Payer: Medicare Other | Attending: Emergency Medicine | Admitting: Emergency Medicine

## 2019-05-03 ENCOUNTER — Other Ambulatory Visit: Payer: Self-pay

## 2019-05-03 ENCOUNTER — Encounter: Payer: Self-pay | Admitting: Emergency Medicine

## 2019-05-03 ENCOUNTER — Emergency Department: Payer: Medicare Other

## 2019-05-03 DIAGNOSIS — N390 Urinary tract infection, site not specified: Secondary | ICD-10-CM

## 2019-05-03 DIAGNOSIS — E860 Dehydration: Secondary | ICD-10-CM | POA: Diagnosis not present

## 2019-05-03 DIAGNOSIS — Z79899 Other long term (current) drug therapy: Secondary | ICD-10-CM | POA: Diagnosis not present

## 2019-05-03 DIAGNOSIS — R531 Weakness: Secondary | ICD-10-CM | POA: Diagnosis not present

## 2019-05-03 DIAGNOSIS — I10 Essential (primary) hypertension: Secondary | ICD-10-CM | POA: Diagnosis not present

## 2019-05-03 LAB — BASIC METABOLIC PANEL
Anion gap: 10 (ref 5–15)
BUN: 19 mg/dL (ref 8–23)
CO2: 26 mmol/L (ref 22–32)
Calcium: 9.2 mg/dL (ref 8.9–10.3)
Chloride: 106 mmol/L (ref 98–111)
Creatinine, Ser: 1.05 mg/dL — ABNORMAL HIGH (ref 0.44–1.00)
GFR calc Af Amer: 54 mL/min — ABNORMAL LOW (ref >60)
GFR calc non Af Amer: 46 mL/min — ABNORMAL LOW (ref >60)
Glucose, Bld: 118 mg/dL — ABNORMAL HIGH (ref 70–99)
Potassium: 4.2 mmol/L (ref 3.5–5.1)
Sodium: 142 mmol/L (ref 135–145)

## 2019-05-03 LAB — CBC
HCT: 39.6 % (ref 36.0–46.0)
Hemoglobin: 12.8 g/dL (ref 12.0–15.0)
MCH: 27.2 pg (ref 26.0–34.0)
MCHC: 32.3 g/dL (ref 30.0–36.0)
MCV: 84.3 fL (ref 80.0–100.0)
Platelets: 306 10*3/uL (ref 150–400)
RBC: 4.7 MIL/uL (ref 3.87–5.11)
RDW: 14.4 % (ref 11.5–15.5)
WBC: 12.7 10*3/uL — ABNORMAL HIGH (ref 4.0–10.5)
nRBC: 0 % (ref 0.0–0.2)

## 2019-05-03 LAB — HEPATIC FUNCTION PANEL
ALT: 11 U/L (ref 0–44)
AST: 20 U/L (ref 15–41)
Albumin: 3.7 g/dL (ref 3.5–5.0)
Alkaline Phosphatase: 79 U/L (ref 38–126)
Bilirubin, Direct: <0.1 mg/dL (ref 0.0–0.2)
Total Bilirubin: 0.4 mg/dL (ref 0.3–1.2)
Total Protein: 6.7 g/dL (ref 6.5–8.1)

## 2019-05-03 LAB — LIPASE, BLOOD: Lipase: 40 U/L (ref 11–51)

## 2019-05-03 MED ORDER — SODIUM CHLORIDE 0.9 % IV BOLUS
1000.0000 mL | Freq: Once | INTRAVENOUS | Status: AC
  Administered 2019-05-04: 01:00:00 1000 mL via INTRAVENOUS

## 2019-05-03 MED ORDER — SODIUM CHLORIDE 0.9% FLUSH: 3.0000 mL | Freq: Once | INTRAVENOUS | Status: DC

## 2019-05-03 NOTE — ED Triage Notes (Signed)
Pt to ED from home via [facility] c/o generalized weakness that started around 1615 today.  Hx of HTN, denies blood thinners, stroke negative per EMS, their vitals 188/90, 84 HR, 126 CBG.  Denies pain or SOB.  Pt has strong strength equal throughout, sensation the same bilaterally, face symmetrical, no visual changes.  Chest rise even and unlabored, skin WNL, in NAD at this time.

## 2019-05-03 NOTE — ED Provider Notes (Signed)
[facility] Emergency Department Provider Note   ____________________________________________   First MD Initiated Contact with Patient 05/03/19 2307     (approximate)  I have reviewed the triage vital signs and the nursing notes.   HISTORY  Chief Complaint Weakness    HPI Alexis Whitney is a 85 y.o. female brought to the ED from home via EMS with a chief complaint of generalized weakness.  Picked pecans earlier, made pecan pie as well as banana pudding.  Around 4 PM she felt generally weak.  Denies fever, cough, chest pain, shortness of breath, abdominal pain, nausea, vomiting, diarrhea, dizziness.  Denies recent travel or trauma.  No anticoagulant use.       Past Medical History:  Diagnosis Date  . Arthritis   . GERD (gastroesophageal reflux disease)   . Hypertension     Patient Active Problem List   Diagnosis Date Noted  . Idiopathic hypertension 02/04/2016  . GERD without esophagitis 02/04/2016    Past Surgical History:  Procedure Laterality Date  . HERNIA REPAIR      Prior to Admission medications   Medication Sig Start Date End Date Taking? Authorizing Provider  lisinopril (PRINIVIL,ZESTRIL) 5 MG tablet Take 5 mg by mouth daily.    [provider]  traZODone (DESYREL) 50 MG tablet TAKE 1 4 (ONE FOURTH) TABLET BY MOUTH AT BEDTIME AS NEEDED 10/21/18   [provider]    Allergies Influenza vaccines, Penicillins, Aspirin, and Sulfa antibiotics  Family History  Problem Relation Age of Onset  . Cancer Mother        Kidney  . Cancer Sister        Lung  . Bladder Cancer Neg Hx     Social History Social History   Tobacco Use  . Smoking status: Never Smoker  . Smokeless tobacco: Never Used  Substance Use Topics  . Alcohol use: No  . Drug use: No    Review of Systems  Constitutional: Positive for generalized weakness.  No fever/chills Eyes: No visual changes. ENT: No sore throat. Cardiovascular: Denies  chest pain. Respiratory: Denies shortness of breath. Gastrointestinal: No abdominal pain.  No nausea, no vomiting.  No diarrhea.  No constipation. Genitourinary: Negative for dysuria. Musculoskeletal: Negative for back pain. Skin: Negative for rash. Neurological: Negative for headaches, focal weakness or numbness.   ____________________________________________   PHYSICAL EXAM:  VITAL SIGNS: ED Triage Vitals  Enc Vitals Group     BP 05/03/19 2219 (!) 164/69     Pulse Rate 05/03/19 2219 99     Resp 05/03/19 2219 17     Temp 05/03/19 2219 98.2 F (36.8 C)     Temp src --      SpO2 05/03/19 2219 99 %     Weight 05/03/19 1733 114 lb (51.7 kg)     Height 05/03/19 1733 5\' 3"  (1.6 m)     Head Circumference --      Peak Flow --      Pain Score 05/03/19 1733 0     Pain Loc --      Pain Edu? --      Excl. in GC? --     Constitutional: Alert and oriented. Well appearing and in no acute distress. Eyes: Conjunctivae are normal. PERRL. EOMI. Head: Atraumatic. Nose: No congestion/rhinnorhea. Mouth/Throat: Mucous membranes are mildly dry.  Oropharynx non-erythematous. Neck: No stridor.   Cardiovascular: Normal rate, regular rhythm. Grossly normal heart sounds.  Good peripheral circulation. Respiratory: Normal respiratory effort.  No retractions. Lungs CTAB. Gastrointestinal: Soft and nontender to light or deep palpation. No distention. No abdominal bruits. No CVA tenderness. Musculoskeletal: No lower extremity tenderness nor edema.  No joint effusions. Neurologic:  Normal speech and language. No gross focal neurologic deficits are appreciated. No gait instability. Skin:  Skin is warm, dry and intact. No rash noted. Psychiatric: Mood and affect are normal. Speech and behavior are normal.  ____________________________________________   LABS (all labs ordered are listed, but only abnormal results are displayed)  Labs Reviewed  CBC - Abnormal; Notable for the following components:       Result Value   WBC 12.7 (*)    All other components within normal limits  URINALYSIS, COMPLETE (UACMP) WITH MICROSCOPIC - Abnormal; Notable for the following components:   Color, Urine YELLOW (*)    APPearance CLEAR (*)    Hgb urine dipstick MODERATE (*)    Leukocytes,Ua TRACE (*)    All other components within normal limits  BASIC METABOLIC PANEL - Abnormal; Notable for the following components:   Glucose, Bld 118 (*)    Creatinine, Ser 1.05 (*)    GFR calc non Af Amer 46 (*)    GFR calc Af Amer 54 (*)    All other components within normal limits  URINE CULTURE  HEPATIC FUNCTION PANEL  LIPASE, BLOOD  TROPONIN I (HIGH SENSITIVITY)   ____________________________________________  EKG  ED ECG REPORT I, Brynli Ollis J, the attending physician, personally viewed and interpreted this ECG.   Date: 05/03/2019  EKG Time: 1729  Rate: 70  Rhythm: normal EKG, normal sinus rhythm  Axis: Normal  Intervals:none  ST&T Change: Nonspecific  ____________________________________________  RADIOLOGY  ED MD interpretation: No ICH; no acute cardiopulmonary process  Official radiology report(s): Ct Head Wo Contrast  Result Date: 05/03/2019 CLINICAL DATA:  Altered level of consciousness, generalized weakness EXAM: CT HEAD WITHOUT CONTRAST TECHNIQUE: Contiguous axial images were obtained from the base of the skull through the vertex without intravenous contrast. COMPARISON:  CT 08/01/2017 FINDINGS: Brain: No evidence of acute infarction, hemorrhage, hydrocephalus, extra-axial collection or mass lesion/mass effect. Symmetric prominence of the ventricles, cisterns and sulci compatible with parenchymal volume loss. Patchy areas of white matter hypoattenuation are most compatible with chronic microvascular angiopathy. Vascular: Atherosclerotic calcification of the carotid siphons. No hyperdense vessel. Skull: No calvarial fracture or suspicious osseous lesion. No scalp swelling or hematoma.  Sinuses/Orbits: Paranasal sinuses and mastoid air cells are predominantly clear. Orbital structures are unremarkable aside from prior lens extractions. Other: None IMPRESSION: No acute intracranial abnormality. Generalized parenchymal volume loss and chronic microvascular angiopathy, similar to priors. Electronically Signed   By: Lovena Le M.D.   On: 05/03/2019 18:01   Dg Chest Port 1 View  Result Date: 05/03/2019 CLINICAL DATA:  Weakness EXAM: PORTABLE CHEST 1 VIEW COMPARISON:  11/08/2018 FINDINGS: There is hyperinflation of the lungs compatible with COPD. Heart is borderline in size. Linear scarring in the left base. No acute confluent opacities or effusions. No acute bony abnormality. IMPRESSION: COPD/chronic changes.  No active disease. Electronically Signed   By: Rolm Baptise M.D.   On: 05/03/2019 23:38    ____________________________________________   PROCEDURES  Procedure(s) performed (including Critical Care):  Procedures   ____________________________________________   INITIAL IMPRESSION / ASSESSMENT AND PLAN / ED COURSE  As part of my medical decision making, I reviewed the following data within the [place] notes reviewed and incorporated, Labs reviewed, EKG interpreted, Old chart reviewed, Radiograph reviewed and Notes  from prior ED visits     Alexis Whitney was evaluated in Emergency Department on 05/04/2019 for the symptoms described in the history of present illness. She was evaluated in the context of the global COVID-19 pandemic, which necessitated consideration that the patient might be at risk for infection with the SARS-CoV-2 virus that causes COVID-19. Institutional protocols and algorithms that pertain to the evaluation of patients at risk for COVID-19 are in a state of rapid change based on information released by regulatory bodies including the CDC and federal and state organizations. These policies and algorithms were followed during the  patient's care in the ED.    85 year old female who presents with generalized weakness.  Differential diagnosis includes but is not limited to ACS, CVA, dehydration, infectious, metabolic etiologies, etc.  Laboratory results demonstrates increased creatinine compared to prior.  Will initiate IV fluid resuscitation.  Will add LFTs and troponin.  Check chest x-ray, urinalysis, orthostatics.    Clinical Course as of May 04 23  Wed May 04, 2019  0022 Updated patient who presents to the ED from home with a chief complaint of dizziness.  All laboratory and imaging result.  She will receive fosfomycin and IV fluids prior to discharge.  Strict return precautions given.  Patient verbalizes understanding and agrees with plan of care.   [JS]    Clinical Course User Index [JS] Irean Hong, MD     ____________________________________________   FINAL CLINICAL IMPRESSION(S) / ED DIAGNOSES  Final diagnoses:  Generalized weakness  Dehydration  Lower urinary tract infectious disease     ED Discharge Orders    None       Note:  This document was prepared using Dragon voice recognition software and may include unintentional dictation errors.   Irean Hong, MD 05/04/19 [phone]

## 2019-05-03 NOTE — ED Notes (Signed)
Lab called said green top hemolyzed and to resend.

## 2019-05-04 LAB — TROPONIN I (HIGH SENSITIVITY): Troponin I (High Sensitivity): 6 ng/L (ref <18)

## 2019-05-04 LAB — URINALYSIS, COMPLETE (UACMP) WITH MICROSCOPIC
Bacteria, UA: NONE SEEN
Bilirubin Urine: NEGATIVE
Glucose, UA: NEGATIVE mg/dL
Ketones, ur: NEGATIVE mg/dL
Nitrite: NEGATIVE
Protein, ur: NEGATIVE mg/dL
Specific Gravity, Urine: 1.013 (ref 1.005–1.030)
pH: 5 (ref 5.0–8.0)

## 2019-05-04 LAB — URINE CULTURE: Culture: NO GROWTH

## 2019-05-04 MED ORDER — FOSFOMYCIN TROMETHAMINE 3 G PO PACK
3.0000 g | PACK | Freq: Once | ORAL | Status: AC
  Administered 2019-05-04: 3 g via ORAL
  Filled 2019-05-04: qty 3

## 2019-05-04 NOTE — Discharge Instructions (Signed)
1.  Drink plenty of fluids daily. °2.  Return to the ER for worsening symptoms, persistent vomiting, difficulty breathing or other concerns. °

## 2020-12-27 ENCOUNTER — Other Ambulatory Visit: Payer: Medicare Other

## 2020-12-27 ENCOUNTER — Encounter: Payer: Medicare Other | Admitting: Oncology

## 2021-01-04 ENCOUNTER — Inpatient Hospital Stay: Payer: Medicare Other | Attending: Oncology | Admitting: Internal Medicine

## 2021-01-04 ENCOUNTER — Encounter: Payer: Self-pay | Admitting: Internal Medicine

## 2021-01-04 ENCOUNTER — Inpatient Hospital Stay: Payer: Medicare Other

## 2021-01-04 ENCOUNTER — Other Ambulatory Visit: Payer: Self-pay

## 2021-01-04 DIAGNOSIS — D72820 Lymphocytosis (symptomatic): Secondary | ICD-10-CM | POA: Insufficient documentation

## 2021-01-04 LAB — TECHNOLOGIST SMEAR REVIEW: Plt Morphology: INCREASED

## 2021-01-04 LAB — COMPREHENSIVE METABOLIC PANEL
ALT: 9 U/L (ref 0–44)
AST: 22 U/L (ref 15–41)
Albumin: 3.7 g/dL (ref 3.5–5.0)
Alkaline Phosphatase: 77 U/L (ref 38–126)
Anion gap: 9 (ref 5–15)
BUN: 18 mg/dL (ref 8–23)
CO2: 28 mmol/L (ref 22–32)
Calcium: 8.8 mg/dL — ABNORMAL LOW (ref 8.9–10.3)
Chloride: 103 mmol/L (ref 98–111)
Creatinine, Ser: 0.84 mg/dL (ref 0.44–1.00)
GFR, Estimated: >60 mL/min (ref >60)
Glucose, Bld: 143 mg/dL — ABNORMAL HIGH (ref 70–99)
Potassium: 3.5 mmol/L (ref 3.5–5.1)
Sodium: 140 mmol/L (ref 135–145)
Total Bilirubin: 0.5 mg/dL (ref 0.3–1.2)
Total Protein: 6.5 g/dL (ref 6.5–8.1)

## 2021-01-04 LAB — CBC WITH DIFFERENTIAL/PLATELET
Abs Immature Granulocytes: 0.06 10*3/uL (ref 0.00–0.07)
Basophils Absolute: 0.1 10*3/uL (ref 0.0–0.1)
Basophils Relative: 1 %
Eosinophils Absolute: 0 10*3/uL (ref 0.0–0.5)
Eosinophils Relative: 0 %
HCT: 38.6 % (ref 36.0–46.0)
Hemoglobin: 12.5 g/dL (ref 12.0–15.0)
Immature Granulocytes: 0 %
Lymphocytes Relative: 67 %
Lymphs Abs: 11.6 10*3/uL — ABNORMAL HIGH (ref 0.7–4.0)
MCH: 27.5 pg (ref 26.0–34.0)
MCHC: 32.4 g/dL (ref 30.0–36.0)
MCV: 85 fL (ref 80.0–100.0)
Monocytes Absolute: 0.9 10*3/uL (ref 0.1–1.0)
Monocytes Relative: 5 %
Neutro Abs: 4.7 10*3/uL (ref 1.7–7.7)
Neutrophils Relative %: 27 %
Platelets: 428 10*3/uL — ABNORMAL HIGH (ref 150–400)
RBC: 4.54 MIL/uL (ref 3.87–5.11)
RDW: 15.3 % (ref 11.5–15.5)
Smear Review: NORMAL
WBC Morphology: ABNORMAL
WBC: 17.3 10*3/uL — ABNORMAL HIGH (ref 4.0–10.5)
nRBC: 0 % (ref 0.0–0.2)

## 2021-01-04 LAB — LACTATE DEHYDROGENASE: LDH: 117 U/L (ref 98–192)

## 2021-01-04 NOTE — Assessment & Plan Note (Addendum)
#  Chronic lymphocytosis/along with monocytosis [June 2022]-concerning for low-grade lympho/myeloproliferative neoplasm.  However hemoglobin/platelets are normal.  I reviewed my concerns for low-grade neoplasm-however given the chronicity/also rest of the clinically normal I do not suspect any aggressiveness.    #Recommend CBC CMP LDH; peripheral blood flow cytometry; T review of smear.  Discussed regarding a bone marrow biopsy as part of the work-up; hold for now.   #Discussed with the patient/daughter that even if this is a low-grade neoplasm-these are not typically treated unless patient is symptomatic with significant B symptoms-weight loss feverish night sweats etc.  With regards to fatigue I think is quite nonspecific-which is likely second other events rather than underlying low-grade lympho-myeloproliferative neoplasm.   # Thank you Dr. Netty Starring for allowing me to participate in the care of your pleasant patient. Please do not hesitate to contact me with questions or concerns in the interim.  If no surprises on above work-up-patient will be followed by PCP; with [place] as needed.  # DISPOSITION: frida/afternoon # blood work today- ofdered  # Follow up in 2 weeks- MD; No labs-Dr.B  .

## 2021-01-04 NOTE — Progress Notes (Signed)
[place] OFFICE PROGRESS NOTE  Patient Care Team: Medicine, Luvenia Heller Family as PCP - General   # HEMATOLOGY HISTORY:  # LEUCOCYTOSIS/lymphocytosis/monocytosis- WBC-17 [predominant leukocytosis monocytosis]; N; L; Hb-12.5; platelets" 214    Oncology History   No history exists.      INTERVAL HISTORY:  Alexis Whitney 87 y.o.  female pleasant patient above history of patient with a long history of chronic mild to moderate leukocytosis/lymphocytosis is here for initial evaluation.  As per patient/family-the patient has been followed with regards to birth control 6 months or so her PCP.  Patient recently had COVID-uncomplicated.  Follow-up with noted to have elevated white count since then.  This prompted further evaluation.  Night sweats:none Weight loss:none  Review of Systems  Constitutional:  Negative for chills, diaphoresis, fever, malaise/fatigue and weight loss.  HENT:  Negative for nosebleeds and sore throat.   Eyes:  Negative for double vision.  Respiratory:  Negative for cough, hemoptysis, sputum production, shortness of breath and wheezing.   Cardiovascular:  Negative for chest pain, palpitations, orthopnea and leg swelling.  Gastrointestinal:  Negative for abdominal pain, blood in stool, constipation, diarrhea, heartburn, melena, nausea and vomiting.  Genitourinary:  Negative for dysuria, frequency and urgency.  Musculoskeletal:  Negative for back pain and joint pain.  Skin: Negative.  Negative for itching and rash.  Neurological:  Negative for dizziness, tingling, focal weakness, weakness and headaches.  Endo/Heme/Allergies:  Does not bruise/bleed easily.  Psychiatric/Behavioral:  Negative for depression. The patient is not nervous/anxious and does not have insomnia.      PAST MEDICAL HISTORY :  Past Medical History:  Diagnosis Date   Arthritis    GERD (gastroesophageal reflux disease)    Hypertension     PAST SURGICAL HISTORY :   Past  Surgical History:  Procedure Laterality Date   HERNIA REPAIR      FAMILY HISTORY :   Family History  Problem Relation Age of Onset   Cancer Mother        Kidney   Heart disease Father    Cancer Sister        Lung   Bladder Cancer Neg Hx     SOCIAL HISTORY:   Social History   Tobacco Use   Smoking status: Never   Smokeless tobacco: Never  Substance Use Topics   Alcohol use: No   Drug use: No    ALLERGIES:  is allergic to influenza vaccines, penicillins, aspirin, and sulfa antibiotics.  MEDICATIONS:  Current Outpatient Medications  Medication Sig Dispense Refill   lisinopril (PRINIVIL,ZESTRIL) 5 MG tablet Take 5 mg by mouth daily.     melatonin 3 MG TABS tablet Take by mouth.     No current facility-administered medications for this visit.    PHYSICAL EXAMINATION:  BP 130/75 (BP Location: Left Arm, Patient Position: Sitting, Cuff Size: Normal)   Pulse 88   Temp (!) 96.5 F (35.8 C) (Tympanic)   Resp 16   Ht $R'5\' 3"'Ku$  (1.6 m)   Wt 106 lb 9.6 oz (48.4 kg)   SpO2 99%   BMI 18.88 kg/m   Filed Weights   01/04/21 1401  Weight: 106 lb 9.6 oz (48.4 kg)    Physical Exam Vitals and nursing note reviewed.  Constitutional:      Comments: Ambulating: independently with family  HENT:     Head: Normocephalic and atraumatic.     Mouth/Throat:     Pharynx: Oropharynx is clear.  Eyes:     Extraocular  Movements: Extraocular movements intact.     Pupils: Pupils are equal, round, and reactive to light.  Cardiovascular:     Rate and Rhythm: Normal rate and regular rhythm.  Pulmonary:     Comments: Decreased breath sounds bilaterally.  Abdominal:     Palpations: Abdomen is soft.  Musculoskeletal:        General: Normal range of motion.     Cervical back: Normal range of motion.  Skin:    General: Skin is warm.  Neurological:     General: No focal deficit present.     Mental Status: She is alert and oriented to person, place, and time.  Psychiatric:         Behavior: Behavior normal.        Judgment: Judgment normal.       LABORATORY DATA:  I have reviewed the data as listed    Component Value Date/Time   NA 142 05/03/2019 1834   K 4.2 05/03/2019 1834   CL 106 05/03/2019 1834   CO2 26 05/03/2019 1834   GLUCOSE 118 (H) 05/03/2019 1834   BUN 19 05/03/2019 1834   CREATININE 1.05 (H) 05/03/2019 1834   CALCIUM 9.2 05/03/2019 1834   PROT 6.7 05/03/2019 1834   ALBUMIN 3.7 05/03/2019 1834   AST 20 05/03/2019 1834   ALT 11 05/03/2019 1834   ALKPHOS 79 05/03/2019 1834   BILITOT 0.4 05/03/2019 1834   GFRNONAA 46 (L) 05/03/2019 1834   GFRAA 54 (L) 05/03/2019 1834    No results found for: SPEP, UPEP  Lab Results  Component Value Date   WBC 12.7 (H) 05/03/2019   NEUTROABS 3.7 11/08/2018   HGB 12.8 05/03/2019   HCT 39.6 05/03/2019   MCV 84.3 05/03/2019   PLT 306 05/03/2019      Chemistry      Component Value Date/Time   NA 142 05/03/2019 1834   K 4.2 05/03/2019 1834   CL 106 05/03/2019 1834   CO2 26 05/03/2019 1834   BUN 19 05/03/2019 1834   CREATININE 1.05 (H) 05/03/2019 1834      Component Value Date/Time   CALCIUM 9.2 05/03/2019 1834   ALKPHOS 79 05/03/2019 1834   AST 20 05/03/2019 1834   ALT 11 05/03/2019 1834   BILITOT 0.4 05/03/2019 1834       RADIOGRAPHIC STUDIES: I have personally reviewed the radiological images as listed and agreed with the findings in the report. No results found.   ASSESSMENT & PLAN:  Lymphocytosis #Chronic lymphocytosis/along with monocytosis [June 2022]-concerning for low-grade lympho/myeloproliferative neoplasm.  However hemoglobin/platelets are normal.  I reviewed my concerns for low-grade neoplasm-however given the chronicity/also rest of the clinically normal I do not suspect any aggressiveness.    #Recommend CBC CMP LDH; peripheral blood flow cytometry; T review of smear.  Discussed regarding a bone marrow biopsy as part of the work-up; hold for now.   #Discussed with the  patient/daughter that even if this is a low-grade neoplasm-these are not typically treated unless patient is symptomatic with significant B symptoms-weight loss feverish night sweats etc.  With regards to fatigue I think is quite nonspecific-which is likely second other events rather than underlying low-grade lympho-myeloproliferative neoplasm.   # Thank you Dr. Netty Starring for allowing me to participate in the care of your pleasant patient. Please do not hesitate to contact me with questions or concerns in the interim.  If no surprises on above work-up-patient will be followed by PCP; with [place] as needed.  # DISPOSITION:  frida/afternoon # blood work today- ofdered  # Follow up in 2 weeks- MD; No labs-Dr.B  .   Orders Placed This Encounter  Procedures   CBC with Differential/Platelet    Standing Status:   Future    Number of Occurrences:   1    Standing Expiration Date:   01/04/2022   Comprehensive metabolic panel    Standing Status:   Future    Number of Occurrences:   1    Standing Expiration Date:   01/04/2022   Lactate dehydrogenase    Standing Status:   Future    Number of Occurrences:   1    Standing Expiration Date:   01/04/2022   Flow cytometry panel-leukemia/lymphoma work-up    Standing Status:   Future    Number of Occurrences:   1    Standing Expiration Date:   01/04/2022   Technologist smear review    Standing Status:   Future    Number of Occurrences:   1    Standing Expiration Date:   01/04/2022   All questions were answered. The patient knows to call the clinic with any problems, questions or concerns.      Cammie Sickle, MD 01/04/2021 2:45 PM

## 2021-01-07 LAB — COMP PANEL: LEUKEMIA/LYMPHOMA: Immunophenotypic Profile: 27

## 2021-01-09 IMAGING — DX PORTABLE CHEST - 1 VIEW
1 series · 1 of 1 positions shown · non-contrast
Comparison: 04/14/2017

CLINICAL DATA: Right shoulder pain secondary to a fall this
morning. Dizziness. Weakness.

EXAM:
PORTABLE CHEST 1 VIEW

[chest ap]
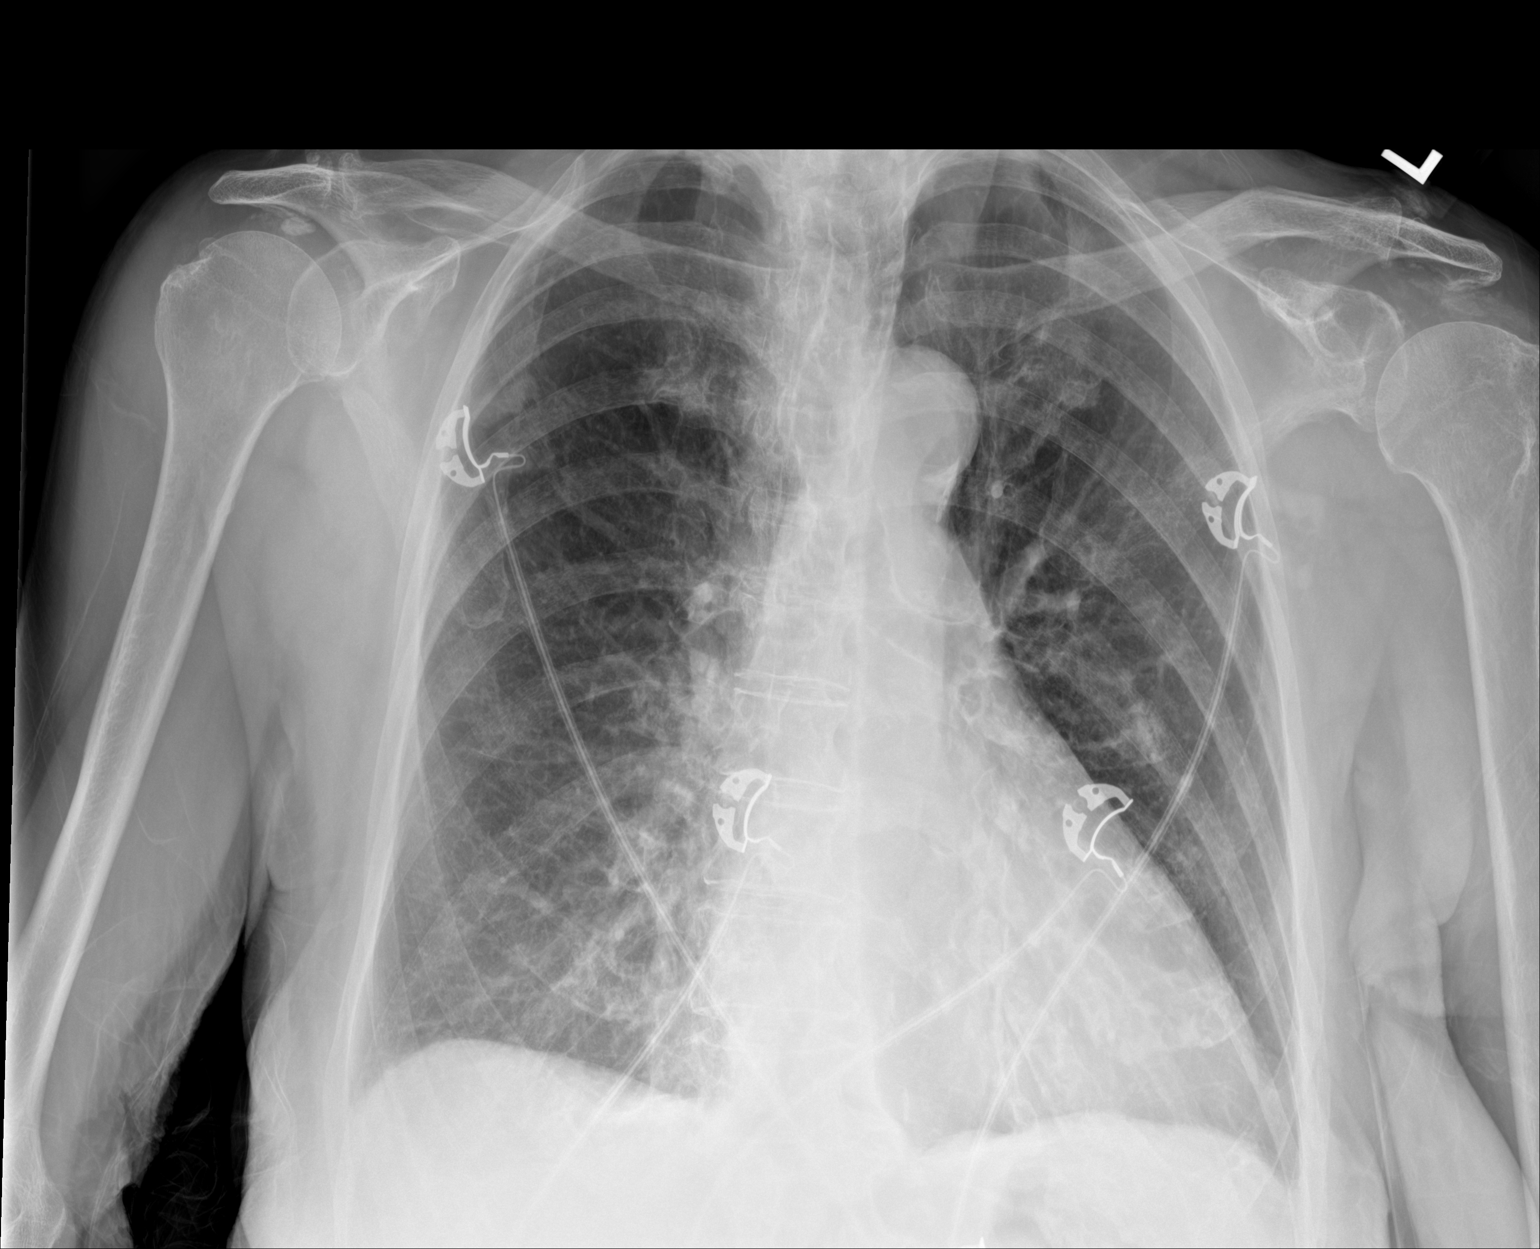

[1 of 1 positions shown; findings below may reference images not displayed]

FINDINGS: The heart size and pulmonary vascularity are normal and the lungs
are clear.

No acute bone abnormality. However, the patient has developed
extensive bilateral calcific tendinopathy of both shoulders.
Moderate chronic arthropathy at both AC joints.

Calcification in the thoracic aorta.
IMPRESSION: No acute abnormality.

Aortic Atherosclerosis ([id number]).

Calcific tendinopathy of both shoulders.

## 2021-01-18 ENCOUNTER — Other Ambulatory Visit: Payer: Self-pay

## 2021-01-18 ENCOUNTER — Inpatient Hospital Stay: Payer: Medicare Other | Admitting: Internal Medicine

## 2021-01-18 ENCOUNTER — Encounter: Payer: Self-pay | Admitting: Internal Medicine

## 2021-01-18 DIAGNOSIS — D72820 Lymphocytosis (symptomatic): Secondary | ICD-10-CM | POA: Diagnosis not present

## 2021-01-18 NOTE — Progress Notes (Signed)
[facility] OFFICE PROGRESS NOTE  Patient Care Team: Dion Body, MD as PCP - General (Family Medicine)   # HEMATOLOGY HISTORY:  # LEUCOCYTOSIS/lymphocytosis/monocytosis- WBC-17 [predominant leukocytosis monocytosis]; N; L; Hb-12.5; platelets" 214-July 2022- #Chronic lymphocytosis/along with monocytosis [June 2022]-flow cytometry: Not suggestive of CLL/SLL mantle cell lymphoma high risk leukemia for the presence of lymphoma; given small population of monoclonal B cells-B-cell lymphocytosis of undetermined significance or treated lymphoma [patient does not have a history of lymphoma]; Given the flow cytometry findings of CD5-, CD10-, CD103-clonal B cell population is detected. Light chain experssion could not be determined.-Reactive e or neoplastic process-however reactive process is favored.     Oncology History   No history exists.      INTERVAL HISTORY:  Alexis Whitney 87 y.o.  female pleasant patient above history of patient with a long history of chronic mild to moderate leukocytosis/lymphocytosis is here to review the results of the blood work ordered at last visit.  Patient denies any nausea vomiting.  Denies any fevers or chills.  Appetite is good.  No weight loss.  Review of Systems  Constitutional:  Negative for chills, diaphoresis, fever, malaise/fatigue and weight loss.  HENT:  Negative for nosebleeds and sore throat.   Eyes:  Negative for double vision.  Respiratory:  Negative for cough, hemoptysis, sputum production, shortness of breath and wheezing.   Cardiovascular:  Negative for chest pain, palpitations, orthopnea and leg swelling.  Gastrointestinal:  Negative for abdominal pain, blood in stool, constipation, diarrhea, heartburn, melena, nausea and vomiting.  Genitourinary:  Negative for dysuria, frequency and urgency.  Musculoskeletal:  Negative for back pain and joint pain.  Skin: Negative.  Negative for itching and rash.  Neurological:  Negative  for dizziness, tingling, focal weakness, weakness and headaches.  Endo/Heme/Allergies:  Does not bruise/bleed easily.  Psychiatric/Behavioral:  Negative for depression. The patient is not nervous/anxious and does not have insomnia.      PAST MEDICAL HISTORY :  Past Medical History:  Diagnosis Date   Arthritis    GERD (gastroesophageal reflux disease)    Hypertension     PAST SURGICAL HISTORY :   Past Surgical History:  Procedure Laterality Date   HERNIA REPAIR      FAMILY HISTORY :   Family History  Problem Relation Age of Onset   Cancer Mother        Kidney   Heart disease Father    Cancer Sister        Lung   Bladder Cancer Neg Hx     SOCIAL HISTORY:   Social History   Tobacco Use   Smoking status: Never   Smokeless tobacco: Never  Substance Use Topics   Alcohol use: No   Drug use: No    ALLERGIES:  is allergic to influenza vaccines, penicillins, aspirin, and sulfa antibiotics.  MEDICATIONS:  Current Outpatient Medications  Medication Sig Dispense Refill   lisinopril (PRINIVIL,ZESTRIL) 5 MG tablet Take 5 mg by mouth daily.     melatonin 3 MG TABS tablet Take by mouth.     No current facility-administered medications for this visit.    PHYSICAL EXAMINATION:  BP (!) 116/48   Pulse 79   Temp (!) 96.7 F (35.9 C) (Tympanic)   Resp 18   There were no vitals filed for this visit.   Physical Exam Vitals and nursing note reviewed.  Constitutional:      Comments: Ambulating: independently with family  HENT:     Head: Normocephalic and atraumatic.  Mouth/Throat:     Pharynx: Oropharynx is clear.  Eyes:     Extraocular Movements: Extraocular movements intact.     Pupils: Pupils are equal, round, and reactive to light.  Cardiovascular:     Rate and Rhythm: Normal rate and regular rhythm.  Pulmonary:     Comments: Decreased breath sounds bilaterally.  Abdominal:     Palpations: Abdomen is soft.  Musculoskeletal:        General: Normal range of  motion.     Cervical back: Normal range of motion.  Skin:    General: Skin is warm.  Neurological:     General: No focal deficit present.     Mental Status: She is alert and oriented to person, place, and time.  Psychiatric:        Behavior: Behavior normal.        Judgment: Judgment normal.       LABORATORY DATA:  I have reviewed the data as listed    Component Value Date/Time   NA 140 01/04/2021 1440   K 3.5 01/04/2021 1440   CL 103 01/04/2021 1440   CO2 28 01/04/2021 1440   GLUCOSE 143 (H) 01/04/2021 1440   BUN 18 01/04/2021 1440   CREATININE 0.84 01/04/2021 1440   CALCIUM 8.8 (L) 01/04/2021 1440   PROT 6.5 01/04/2021 1440   ALBUMIN 3.7 01/04/2021 1440   AST 22 01/04/2021 1440   ALT 9 01/04/2021 1440   ALKPHOS 77 01/04/2021 1440   BILITOT 0.5 01/04/2021 1440   GFRNONAA >60 01/04/2021 1440   GFRAA 54 (L) 05/03/2019 1834    No results found for: SPEP, UPEP  Lab Results  Component Value Date   WBC 17.3 (H) 01/04/2021   NEUTROABS 4.7 01/04/2021   HGB 12.5 01/04/2021   HCT 38.6 01/04/2021   MCV 85.0 01/04/2021   PLT 428 (H) 01/04/2021      Chemistry      Component Value Date/Time   NA 140 01/04/2021 1440   K 3.5 01/04/2021 1440   CL 103 01/04/2021 1440   CO2 28 01/04/2021 1440   BUN 18 01/04/2021 1440   CREATININE 0.84 01/04/2021 1440      Component Value Date/Time   CALCIUM 8.8 (L) 01/04/2021 1440   ALKPHOS 77 01/04/2021 1440   AST 22 01/04/2021 1440   ALT 9 01/04/2021 1440   BILITOT 0.5 01/04/2021 1440       RADIOGRAPHIC STUDIES: I have personally reviewed the radiological images as listed and agreed with the findings in the report. No results found.   ASSESSMENT & PLAN:  Lymphocytosis #Chronic lymphocytosis/along with monocytosis [June 2022]-flow cytometry: Not suggestive of CLL/SLL mantle cell lymphoma high risk leukemia for the presence of lymphoma; given small population of monoclonal B cells-B-cell lymphocytosis of undetermined  significance or treated lymphoma [patient does not have a history of lymphoma]  #Given the flow cytometry findings of CD5-, CD10-, CD103-clonal B cell population is detected. Light chain  experssion could not be determined.-Reactive e or neoplastic process-however reactive process is favored.   # We discussed that in general would recommend further imaging like PET scan/bone marrow biopsy.  However, I had a long discussion the patient/and her daughter regarding the above inconclusive but absence of ominous findings/lack of symptoms or lack of anemia or thrombocytopenia.  Especially context of her age/pain is reasonable to limit further work-up at this time-unless patient is symptomatic.  For now she wants to follow-up with PCP/and will reach out to [place] if she is  symptomatic for has any significant concerns.  This was discussed at length with the patient and daughter distribution agreement.   # DISPOSITION: # follow up as needed-Dr.B  Cc; Dr.Linthavong-  .   No orders of the defined types were placed in this encounter.  All questions were answered. The patient knows to call the clinic with any problems, questions or concerns.      Cammie Sickle, MD 01/20/2021 3:48 PM

## 2021-01-18 NOTE — Assessment & Plan Note (Addendum)
#  Chronic lymphocytosis/along with monocytosis [June 2022]-flow cytometry: Not suggestive of CLL/SLL mantle cell lymphoma high risk leukemia for the presence of lymphoma; given small population of monoclonal B cells-B-cell lymphocytosis of undetermined significance or treated lymphoma [patient does not have a history of lymphoma]  #Given the flow cytometry findings of CD5-, CD10-, CD103-clonal B cell population is detected. Light chain  experssion could not be determined.-Reactive e or neoplastic process-however reactive process is favored.   # We discussed that in general would recommend further imaging like PET scan/bone marrow biopsy.  However, I had a long discussion the patient/and her daughter regarding the above inconclusive but absence of ominous findings/lack of symptoms or lack of anemia or thrombocytopenia.  Especially context of her age/pain is reasonable to limit further work-up at this time-unless patient is symptomatic.  For now she wants to follow-up with PCP/and will reach out to [place] if she is symptomatic for has any significant concerns.  This was discussed at length with the patient and daughter distribution agreement.   # DISPOSITION: # follow up as needed-Dr.B  Cc; Dr.Linthavong-  .

## 2022-11-19 ENCOUNTER — Ambulatory Visit
Admission: EM | Admit: 2022-11-19 | Discharge: 2022-11-19 | Disposition: A | Discharge status: Home / Self Care | Payer: Medicare Other | Attending: Emergency Medicine | Admitting: Emergency Medicine

## 2022-11-19 ENCOUNTER — Ambulatory Visit (INDEPENDENT_AMBULATORY_CARE_PROVIDER_SITE_OTHER): Payer: Medicare Other

## 2022-11-19 DIAGNOSIS — M25531 Pain in right wrist: Secondary | ICD-10-CM

## 2022-11-19 DIAGNOSIS — M25431 Effusion, right wrist: Secondary | ICD-10-CM | POA: Diagnosis not present

## 2022-11-19 DIAGNOSIS — L03119 Cellulitis of unspecified part of limb: Secondary | ICD-10-CM | POA: Diagnosis not present

## 2022-11-19 MED ORDER — PREDNISONE 10 MG PO TABS: 40.0000 mg | ORAL_TABLET | Freq: Every day | ORAL | 0 refills | Status: AC

## 2022-11-19 MED ORDER — DOXYCYCLINE HYCLATE 100 MG PO CAPS: 100.0000 mg | ORAL_CAPSULE | Freq: Two times a day (BID) | ORAL | 0 refills | Status: AC

## 2022-11-19 NOTE — ED Provider Notes (Signed)
[facility]    CSN: [id number] Arrival date & time: 11/19/22  1350      History   Chief Complaint Chief Complaint  Patient presents with   Rash    HPI Alexis Whitney is a 89 y.o. female.  Accompanied by her daughter, patient presents with pain, swelling, redness, warmth of her right wrist x 1 day.  Treatment attempted with Voltaren cream.  No falls or injury.  No numbness, weakness, fever, wounds, or other symptoms.    The history is provided by the patient, a relative and medical records.    Past Medical History:  Diagnosis Date   Arthritis    GERD (gastroesophageal reflux disease)    Hypertension     Patient Active Problem List   Diagnosis Date Noted   Lymphocytosis 01/04/2021   Idiopathic hypertension 02/04/2016   GERD without esophagitis 02/04/2016    Past Surgical History:  Procedure Laterality Date   HERNIA REPAIR      OB History     Gravida  2   Para  2   Term      Preterm      AB      Living         SAB      IAB      Ectopic      Multiple      Live Births               Home Medications    Prior to Admission medications   Medication Sig Start Date End Date Taking? Authorizing Provider  doxycycline (VIBRAMYCIN) 100 MG capsule Take 1 capsule (100 mg total) by mouth 2 (two) times daily for 7 days. 11/19/22 11/26/22 Yes Mickie Bail, NP  lisinopril (PRINIVIL,ZESTRIL) 5 MG tablet Take 5 mg by mouth daily.   Yes [provider]  melatonin 3 MG TABS tablet Take by mouth.   Yes [provider]  predniSONE (DELTASONE) 10 MG tablet Take 4 tablets (40 mg total) by mouth daily for 3 days. 11/19/22 11/22/22 Yes Mickie Bail, NP    Family History Family History  Problem Relation Age of Onset   Cancer Mother        Kidney   Heart disease Father    Cancer Sister        Lung   Bladder Cancer Neg Hx     Social History Social History   Tobacco Use   Smoking status: Never   Smokeless tobacco: Never   Substance Use Topics   Alcohol use: No   Drug use: No     Allergies   Influenza vaccines, Penicillins, Aspirin, and Sulfa antibiotics   Review of Systems Review of Systems  Constitutional:  Negative for chills and fever.  Musculoskeletal:  Positive for arthralgias and joint swelling.  Skin:  Positive for color change. Negative for wound.  Neurological:  Negative for weakness and numbness.  All other systems reviewed and are negative.    Physical Exam Triage Vital Signs ED Triage Vitals  Enc Vitals Group     BP 11/19/22 1449 119/71     Pulse Rate 11/19/22 1443 82     Resp 11/19/22 1443 18     Temp 11/19/22 1443 97.8 F (36.6 C)     Temp src --      SpO2 11/19/22 1443 97 %     Weight --      Height --      Head Circumference --  Peak Flow --      Pain Score 11/19/22 1453 3     Pain Loc --      Pain Edu? --      Excl. in GC? --    No data found.  Updated Vital Signs BP 119/71   Pulse 82   Temp 97.8 F (36.6 C)   Resp 18   SpO2 97%   Visual Acuity Right Eye Distance:   Left Eye Distance:   Bilateral Distance:    Right Eye Near:   Left Eye Near:    Bilateral Near:     Physical Exam Vitals and nursing note reviewed.  Constitutional:      General: She is not in acute distress.    Appearance: She is well-developed. She is not ill-appearing.  HENT:     Mouth/Throat:     Mouth: Mucous membranes are moist.  Cardiovascular:     Rate and Rhythm: Normal rate and regular rhythm.  Pulmonary:     Effort: Pulmonary effort is normal. No respiratory distress.  Musculoskeletal:        General: Swelling and tenderness present. No deformity. Normal range of motion.     Cervical back: Neck supple.  Skin:    General: Skin is warm and dry.     Capillary Refill: Capillary refill takes less than 2 seconds.     Findings: Erythema present. No lesion.  Neurological:     General: No focal deficit present.     Mental Status: She is alert.     Sensory: No sensory  deficit.     Motor: No weakness.  Psychiatric:        Mood and Affect: Mood normal.        Behavior: Behavior normal.      UC Treatments / Results  Labs (all labs ordered are listed, but only abnormal results are displayed) Labs Reviewed - No data to display  EKG   Radiology DG Wrist Complete Right  Result Date: 11/19/2022 CLINICAL DATA:  Pain, swelling and redness EXAM: RIGHT WRIST - COMPLETE 3+ VIEW COMPARISON:  None Available. FINDINGS: Widespread osteopenia. Chronic joint space narrowing of the radiocarpal joint with widespread chondrocalcinosis. Chondrocalcinosis also present in the carpus. This could indicate the presence of age related CPPD which can be a cause of pain and swelling. In this patient with osteopenia, there is lucency of the bone at the base of the fifth metacarpal and at the hamate which may simply relate to the osteopenia. I cannot completely rule out a fracture in this location. Is the patient tender at the base of the fifth metacarpal region? IMPRESSION: 1. Widespread osteopenia. 2. Chondrocalcinosis of the radiocarpal joint and the carpus. This can indicate CPPD and be a cause of pain and swelling. 3. Lucency at the base of the fifth metacarpal and at the hamate which may simply relate to the osteopenia. I cannot completely rule out a fracture in this location. Is the patient tender at the base of the fifth metacarpal region? Electronically Signed   By: Paulina Fusi M.D.   On: 11/19/2022 15:39    Procedures Procedures (including critical care time)  Medications Ordered in UC Medications - No data to display  Initial Impression / Assessment and Plan / UC Course  I have reviewed the triage vital signs and the nursing notes.  Pertinent labs & imaging results that were available during my care of the patient were reviewed by me and considered in my medical decision making (see  chart for details).    Pain and swelling of right wrist, cellulitis of wrist.   Afebrile, VSS.  X-ray shows osteopenia.  Patient and family decline wrist splint today.  Ace wrap applied.  Treating with doxycycline and prednisone.  Instructed patient and family to follow-up with her PCP tomorrow.  Education provided on cellulitis and wrist pain.  ED precautions given.  Patient and family agree to plan of care.  Final Clinical Impressions(s) / UC Diagnoses   Final diagnoses:  Pain and swelling of right wrist  Cellulitis of wrist     Discharge Instructions      Take the doxycycline and prednisone as directed.  Follow up with your primary care provider tomorrow.        ED Prescriptions     Medication Sig Dispense Auth. Provider   doxycycline (VIBRAMYCIN) 100 MG capsule Take 1 capsule (100 mg total) by mouth 2 (two) times daily for 7 days. 14 capsule Mickie Bail, NP   predniSONE (DELTASONE) 10 MG tablet Take 4 tablets (40 mg total) by mouth daily for 3 days. 12 tablet Mickie Bail, NP      PDMP not reviewed this encounter.   Mickie Bail, NP 11/19/22 [id number]

## 2022-11-19 NOTE — ED Triage Notes (Signed)
Pt presents with rash/ redness on right wrist, pt states she put Voltaren cream on that spot yesterday and was her first time using it.

## 2022-11-19 NOTE — Discharge Instructions (Addendum)
Take the doxycycline and prednisone as directed.  Follow up with your primary care provider tomorrow.

## 2024-05-16 NOTE — Progress Notes (Signed)
 Chief Complaint  Patient presents with  . Herpes Zoster    HPI  Alexis Whitney is a 89 y.o. here for an acute issue.  She has a PMH of HTN, GERD who has developed a rash on the left side of her trunk area around the shoulder and axilla.  Blisters with erythematous base.  Some tenderness.  She had some chest pain few nights ago.    ROS  Pertinent items are noted in HPI.  Outpatient Encounter Medications as of 05/16/2024  Medication Sig Dispense Refill  . lisinopriL  (ZESTRIL ) 5 MG tablet Take 1 tablet (5 mg total) by mouth once daily 100 tablet 1  . nystatin (MYCOSTATIN) 100,000 unit/mL suspension Swish and spit 5 mLs 4 (four) times daily For 10 days 473 mL 0   No facility-administered encounter medications on file as of 05/16/2024.    Allergies as of 05/16/2024 - Reviewed 05/16/2024  Allergen Reaction Noted  . Flu vaccine 2011-12(3 yr+)(pf) Anaphylaxis 03/15/2015  . Aspirin Other (See Comments) 03/12/2015  . Penicillin Unknown 03/15/2015  . Sulfa (sulfonamide antibiotics) Unknown 03/15/2015  . Sulfasalazine Other (See Comments) 03/12/2015    Past Medical History:  Diagnosis Date  . Essential hypertension with goal blood pressure less than 140/90   . GERD without esophagitis     Past Surgical History:  Procedure Laterality Date  . CATARACT EXTRACTION Bilateral     Vitals:   05/16/24 1417  BP: 138/80  Pulse: 76    Physical Exam  General. Well appearing; NAD; VS reviewed     HEENT: Sclera and conjunctiva clear; EOMI,  Lungs. Respirations unlabored; clear to auscultation bilaterally. Cardiovascular. Heart regular rate and rhythm without murmurs, gallops, or rubs. Extremities:  No edema. Skin.  Grouped vesicular lesions along the left shoulder, left axilla and into the left chest, breast area Neurologic. Alert and oriented x3   Assessment and Plan 1. Herpes zoster without complication Not having significant pain. -     valACYclovir (VALTREX) 500 MG tablet; Take 1  tablet (500 mg total) by mouth 3 (three) times daily    I have personally performed this service.  [address] [place], [place]

## 2024-05-18 NOTE — Progress Notes (Signed)
 05/18/2024 = 3 min

## 2024-05-27 ENCOUNTER — Telehealth: Payer: Self-pay | Admitting: Nurse Practitioner

## 2024-05-27 ENCOUNTER — Ambulatory Visit
Admission: RE | Admit: 2024-05-27 | Discharge: 2024-05-27 | Disposition: A | Discharge status: Home / Self Care | Payer: Self-pay | Attending: Nurse Practitioner

## 2024-05-27 VITALS — BP 142/67 | HR 78 | Temp 97.6°F

## 2024-05-27 DIAGNOSIS — B0229 Other postherpetic nervous system involvement: Secondary | ICD-10-CM

## 2024-05-27 MED ORDER — LIDOCAINE 5 % EX PTCH: 1.0000 | MEDICATED_PATCH | CUTANEOUS | 0 refills | Status: AC

## 2024-05-27 MED ORDER — GABAPENTIN 25 MG PO TABS: 25.0000 mg | ORAL_TABLET | Freq: Every day | ORAL | 0 refills | Status: AC

## 2024-05-27 NOTE — Telephone Encounter (Signed)
 [facility] contacted the clinic to report that gabapentin is not available in a 25 mg formulation. They stated that the lowest available strength they can dispense is 100 mg capsules, and tablets are not available for order. Given the patient's age and the risk of adverse effects at higher doses, it is not safe to initiate gabapentin at 100 mg. The gabapentin prescription has been discontinued. The patient should continue using lidocaine patches and Tylenol for symptom relief. Follow up with her primary care provider if these measures do not provide adequate pain control.

## 2024-05-27 NOTE — ED Provider Notes (Signed)
 Alexis Whitney    CSN: [id number] Arrival date & time: 05/27/24  1246      History   Chief Complaint Chief Complaint  Patient presents with   Rash    Has shingles completed prednisone  and valacyclovir on 05/25/24 having stabbing pain - Entered by patient    HPI SISTER CARBONE is a 89 y.o. female.   Discussed the use of AI scribe software for clinical note transcription with the patient, who gave verbal consent to proceed.   History mainly provided by patient's daughter   The patient presents with ongoing pain related to a shingles rash involving the left arm, breast, axillary and back. She reports that the pain began on November 14th, followed by the development of blisters on November 15th. She was evaluated by her primary care provider on November 17th and prescribed valacyclovir 500 mg three times daily. On November 19th, she was started on a five-day course of prednisone  to help with pain and inflammation. Although the rash itself has improved, she continues to experience significant neuralgic pain in the affected areas. She has been taking Tylenol for symptom relief but reports only minimal improvement.  The following sections of the patient's history were reviewed and updated as appropriate: allergies, current medications, past family history, past medical history, past social history, past surgical history, and problem list.     Past Medical History:  Diagnosis Date   Arthritis    GERD (gastroesophageal reflux disease)    Hypertension     Patient Active Problem List   Diagnosis Date Noted   Lymphocytosis 01/04/2021   Idiopathic hypertension 02/04/2016   GERD without esophagitis 02/04/2016    Past Surgical History:  Procedure Laterality Date   HERNIA REPAIR      OB History     Gravida  2   Para  2   Term      Preterm      AB      Living         SAB      IAB      Ectopic      Multiple      Live Births               Home  Medications    Prior to Admission medications   Medication Sig Start Date End Date Taking? Authorizing Provider  gabapentin 25 MG TABS Take 25 mg by mouth at bedtime for 10 days. 05/27/24 06/06/24 Yes Iola Lukes, FNP  lidocaine (LIDODERM) 5 % Place 1 patch onto the skin daily. Apply 1/2 to 1 patch to affected areas once a day for up to 12 hours then remove for 12 hours. Patch may be cut to size to fit affected areas. Apply only to intact skin. 05/27/24  Yes Iola Lukes, FNP  lisinopril  (PRINIVIL ,ZESTRIL ) 5 MG tablet Take 5 mg by mouth daily.   Yes [provider]  melatonin 3 MG TABS tablet Take by mouth.   Yes [provider]    Family History Family History  Problem Relation Age of Onset   Cancer Mother        Kidney   Heart disease Father    Cancer Sister        Lung   Bladder Cancer Neg Hx     Social History Social History   Tobacco Use   Smoking status: Never   Smokeless tobacco: Never  Vaping Use   Vaping status: Never Used  Substance Use Topics  Alcohol use: No   Drug use: No     Allergies   Influenza vaccines, Penicillins, Aspirin, and Sulfa antibiotics   Review of Systems Review of Systems  Skin:  Positive for rash.  All other systems reviewed and are negative.    Physical Exam Triage Vital Signs ED Triage Vitals  Encounter Vitals Group     BP 05/27/24 1331 (!) 172/75     Girls Systolic BP Percentile --      Girls Diastolic BP Percentile --      Boys Systolic BP Percentile --      Boys Diastolic BP Percentile --      Pulse Rate 05/27/24 1331 78     Resp --      Temp 05/27/24 1331 97.6 F (36.4 C)     Temp Source 05/27/24 1331 Oral     SpO2 05/27/24 1331 96 %     Weight --      Height --      Head Circumference --      Peak Flow --      Pain Score 05/27/24 1332 0     Pain Loc --      Pain Education --      Exclude from Growth Chart --    No data found.  Updated Vital Signs BP (!) 142/67 (BP Location: Right  Arm)   Pulse 78   Temp 97.6 F (36.4 C) (Oral)   SpO2 96%   Visual Acuity Right Eye Distance:   Left Eye Distance:   Bilateral Distance:    Right Eye Near:   Left Eye Near:    Bilateral Near:     Physical Exam Vitals reviewed.  Constitutional:      General: She is awake. She is not in acute distress.    Appearance: Normal appearance. She is well-developed. She is not ill-appearing, toxic-appearing or diaphoretic.  HENT:     Head: Normocephalic.     Right Ear: Hearing normal.     Left Ear: Hearing normal.     Nose: Nose normal.     Mouth/Throat:     Mouth: Mucous membranes are moist.  Eyes:     General: Vision grossly intact.     Conjunctiva/sclera: Conjunctivae normal.  Cardiovascular:     Rate and Rhythm: Normal rate and regular rhythm.     Heart sounds: Normal heart sounds.  Pulmonary:     Effort: Pulmonary effort is normal.     Breath sounds: Normal breath sounds and air entry.  Musculoskeletal:        General: Normal range of motion.     Cervical back: Normal range of motion and neck supple.  Skin:    General: Skin is warm and dry.     Findings: Rash present.     Comments: Dermatomal rash is present along the left chest, left upper arm, axillary region, and upper left back. Multiple clustered lesions are observed in various stages of healing, including erythematous patches, crusted areas, scattered shallow erosions, and scabbed-over vesicles. The surrounding skin demonstrates mild erythema without fluctuance, drainage, or swelling.   Neurological:     General: No focal deficit present.     Mental Status: She is alert and oriented to person, place, and time.  Psychiatric:        Speech: Speech normal.        Behavior: Behavior is cooperative.           UC Treatments / Results  Labs (all labs  ordered are listed, but only abnormal results are displayed) Labs Reviewed - No data to display  EKG   Radiology No results found.  Procedures Procedures  (including critical care time)  Medications Ordered in UC Medications - No data to display  Initial Impression / Assessment and Plan / UC Course  I have reviewed the triage vital signs and the nursing notes.  Pertinent labs & imaging results that were available during my care of the patient were reviewed by me and considered in my medical decision making (see chart for details).     The patient presents with persistent pain following a shingles outbreak involving the left arm, breast, axillary, and back. Pain began on November 14th with blistering noted on November 15th. She was initially prescribed valacyclovir 500 mg three times daily on November 17th, which is below the recommended dose of 1000 mg three times daily, and later completed a five-day prednisone  course started on November 19th. Her rash has improved, but she continues to have significant pain consistent with postherpetic neuralgia. Kidney function is adequate with a creatinine of 0.9, allowing for typical neuropathic pain management options. Treatment today includes lidocaine patches to apply for 12 hours on and 12 hours off, with patches cut to fit the affected areas, along with continuation of Tylenol for pain control. Gabapentin 25 mg nightly was started to help reduce nerve-related pain. Ok to supplement with tylenol if needed. She was advised to follow up with her primary care provider if pain persists or worsens. Emergency evaluation was recommended for severe uncontrolled pain, new neurological symptoms, spreading rash, fever, or any other concerning changes. Her blood pressure was initially elevated at 172/75 and improved to 142/67 on repeat; given her recent reading of 138/85 at her PCP visit, she was advised to continue her current lisinopril  5 mg regimen and monitor her blood pressure.  Today's evaluation has revealed no signs of a dangerous process. Discussed diagnosis with patient and/or guardian. Patient and/or guardian  aware of their diagnosis, possible red flag symptoms to watch out for and need for close follow up. Patient and/or guardian understands verbal and written discharge instructions. Patient and/or guardian comfortable with plan and disposition.  Patient and/or guardian has a clear mental status at this time, good insight into illness (after discussion and teaching) and has clear judgment to make decisions regarding their care  Documentation was completed with the aid of voice recognition software. Transcription may contain typographical errors.  Final Clinical Impressions(s) / UC Diagnoses   Final diagnoses:  Postherpetic neuralgia     Discharge Instructions      Your rash from shingles is improving, but the nerve pain you're feeling is common and can continue for weeks to months after the rash clears. Today you were prescribed lidocaine patches to help numb the painful areas; place a patch over the affected skin for 12 hours, then remove it for 12 hours before applying a new one. You may cut the patches to fit comfortably around the rash. You can continue taking Tylenol as needed, and you were started on gabapentin at bedtime to help with nerve-related pain. Please take this medication every night as directed. Continue your lisinopril  for blood pressure, as your readings improved on recheck. Follow up with your primary care doctor if the pain is not improving or becomes difficult to manage. Seek emergency care right away if your pain becomes severe and uncontrolled, if you develop fever, weakness, spreading redness, new rash, facial drooping, vision changes, or any  sudden worsening of symptoms.     ED Prescriptions     Medication Sig Dispense Auth. Provider   lidocaine (LIDODERM) 5 % Place 1 patch onto the skin daily. Apply 1/2 to 1 patch to affected areas once a day for up to 12 hours then remove for 12 hours. Patch may be cut to size to fit affected areas. Apply only to intact skin. 30 patch  Shelle Galdamez, FNP   gabapentin 25 MG TABS Take 25 mg by mouth at bedtime for 10 days. 10 tablet Iola Lukes, FNP      PDMP not reviewed this encounter.   Iola Lukes, [place] 05/27/24 1429

## 2024-05-27 NOTE — ED Triage Notes (Signed)
 Pt is with her daughter  Pt c/o shingles along left arm, breast, chest, and back  Pt was seen for shingles and given tylenol, valacyclovir, prednisone , and trimacinolone cream.  Pt has finished her medication but states that the shingles has not cleared up.

## 2024-05-27 NOTE — Discharge Instructions (Addendum)
 Your rash from shingles is improving, but the nerve pain you're feeling is common and can continue for weeks to months after the rash clears. Today you were prescribed lidocaine patches to help numb the painful areas; place a patch over the affected skin for 12 hours, then remove it for 12 hours before applying a new one. You may cut the patches to fit comfortably around the rash. You can continue taking Tylenol as needed, and you were started on gabapentin at bedtime to help with nerve-related pain. Please take this medication every night as directed. Continue your lisinopril  for blood pressure, as your readings improved on recheck. Follow up with your primary care doctor if the pain is not improving or becomes difficult to manage. Seek emergency care right away if your pain becomes severe and uncontrolled, if you develop fever, weakness, spreading redness, new rash, facial drooping, vision changes, or any sudden worsening of symptoms.

## 2024-05-27 NOTE — Telephone Encounter (Signed)
 Called and spoke to patients daughter Barnie. She verbalized understanding of medication changes and had no further questions.

## 2024-05-27 NOTE — Telephone Encounter (Signed)
 Called patient emergency contact Barnie Cutter, left a message to call back for medication changes.
